# Patient Record
Sex: Male | Born: 1935 | Race: White | Hispanic: No | Marital: Married | Smoking: Current some day smoker
Health system: Southern US, Community
[De-identification: ages and names within clinical notes are randomized; demographics above are authoritative.]

## PROBLEM LIST (undated history)

## (undated) ENCOUNTER — Emergency Department (HOSPITAL_COMMUNITY): Payer: Medicare Other

## (undated) DIAGNOSIS — R739 Hyperglycemia, unspecified: Secondary | ICD-10-CM

## (undated) DIAGNOSIS — I10 Essential (primary) hypertension: Secondary | ICD-10-CM

## (undated) DIAGNOSIS — I2510 Atherosclerotic heart disease of native coronary artery without angina pectoris: Secondary | ICD-10-CM

## (undated) DIAGNOSIS — E7800 Pure hypercholesterolemia, unspecified: Secondary | ICD-10-CM

## (undated) DIAGNOSIS — N200 Calculus of kidney: Secondary | ICD-10-CM

## (undated) DIAGNOSIS — G459 Transient cerebral ischemic attack, unspecified: Secondary | ICD-10-CM

## (undated) HISTORY — DX: Hyperglycemia, unspecified: R73.9

## (undated) HISTORY — DX: Essential (primary) hypertension: I10

## (undated) HISTORY — DX: Pure hypercholesterolemia, unspecified: E78.00

## (undated) HISTORY — DX: Atherosclerotic heart disease of native coronary artery without angina pectoris: I25.10

## (undated) HISTORY — DX: Calculus of kidney: N20.0

## (undated) HISTORY — DX: Transient cerebral ischemic attack, unspecified: G45.9

---

## 2004-05-16 ENCOUNTER — Ambulatory Visit: Payer: Self-pay | Admitting: Internal Medicine

## 2004-06-03 ENCOUNTER — Ambulatory Visit: Payer: Self-pay | Admitting: Urology

## 2004-07-16 ENCOUNTER — Other Ambulatory Visit: Payer: Self-pay

## 2004-07-16 ENCOUNTER — Inpatient Hospital Stay: Payer: Self-pay | Admitting: Internal Medicine

## 2004-07-17 ENCOUNTER — Other Ambulatory Visit: Payer: Self-pay

## 2004-08-08 ENCOUNTER — Emergency Department: Payer: Self-pay | Admitting: Emergency Medicine

## 2006-08-04 ENCOUNTER — Emergency Department: Payer: Self-pay | Admitting: Emergency Medicine

## 2006-08-04 ENCOUNTER — Other Ambulatory Visit: Payer: Self-pay

## 2006-08-12 ENCOUNTER — Ambulatory Visit: Payer: Self-pay | Admitting: Neurology

## 2007-12-24 ENCOUNTER — Ambulatory Visit: Payer: Self-pay | Admitting: Urology

## 2008-09-06 ENCOUNTER — Ambulatory Visit: Payer: Self-pay | Admitting: Internal Medicine

## 2009-07-07 ENCOUNTER — Emergency Department: Payer: Self-pay | Admitting: Emergency Medicine

## 2009-08-16 ENCOUNTER — Ambulatory Visit: Payer: Self-pay | Admitting: Internal Medicine

## 2009-12-26 ENCOUNTER — Ambulatory Visit: Payer: Self-pay | Admitting: Urology

## 2010-01-24 ENCOUNTER — Ambulatory Visit: Payer: Self-pay | Admitting: Internal Medicine

## 2012-01-20 ENCOUNTER — Encounter: Payer: Self-pay | Admitting: Internal Medicine

## 2012-01-21 ENCOUNTER — Ambulatory Visit (INDEPENDENT_AMBULATORY_CARE_PROVIDER_SITE_OTHER): Payer: Medicare Other | Admitting: Internal Medicine

## 2012-01-21 ENCOUNTER — Encounter: Payer: Self-pay | Admitting: Internal Medicine

## 2012-01-21 VITALS — BP 126/68 | HR 64 | Temp 97.8°F | Ht 72.0 in | Wt 172.0 lb

## 2012-01-21 DIAGNOSIS — N200 Calculus of kidney: Secondary | ICD-10-CM

## 2012-01-21 DIAGNOSIS — Z23 Encounter for immunization: Secondary | ICD-10-CM

## 2012-01-21 DIAGNOSIS — G459 Transient cerebral ischemic attack, unspecified: Secondary | ICD-10-CM

## 2012-01-21 DIAGNOSIS — I10 Essential (primary) hypertension: Secondary | ICD-10-CM | POA: Insufficient documentation

## 2012-01-21 DIAGNOSIS — I729 Aneurysm of unspecified site: Secondary | ICD-10-CM

## 2012-01-21 DIAGNOSIS — E7800 Pure hypercholesterolemia, unspecified: Secondary | ICD-10-CM

## 2012-01-21 DIAGNOSIS — R7309 Other abnormal glucose: Secondary | ICD-10-CM

## 2012-01-21 DIAGNOSIS — R739 Hyperglycemia, unspecified: Secondary | ICD-10-CM

## 2012-01-21 DIAGNOSIS — I2510 Atherosclerotic heart disease of native coronary artery without angina pectoris: Secondary | ICD-10-CM

## 2012-01-21 NOTE — Patient Instructions (Signed)
It was nice seeing you today.  I am glad you have been doing well.  Let me know if you have any problems.  

## 2012-01-25 ENCOUNTER — Encounter: Payer: Self-pay | Admitting: Internal Medicine

## 2012-01-25 DIAGNOSIS — N200 Calculus of kidney: Secondary | ICD-10-CM | POA: Insufficient documentation

## 2012-01-25 DIAGNOSIS — G459 Transient cerebral ischemic attack, unspecified: Secondary | ICD-10-CM | POA: Insufficient documentation

## 2012-01-25 DIAGNOSIS — R739 Hyperglycemia, unspecified: Secondary | ICD-10-CM | POA: Insufficient documentation

## 2012-01-25 DIAGNOSIS — I729 Aneurysm of unspecified site: Secondary | ICD-10-CM | POA: Insufficient documentation

## 2012-01-25 NOTE — Assessment & Plan Note (Signed)
Sees cardiology.  Currently asymptomatic.  Due a follow up.  Will schedule.  Had stress echo 03/26/09 that was normal without evidence of ischemia.  EF 60-70%.  Continue aggressive risk factor modification.

## 2012-01-25 NOTE — Assessment & Plan Note (Signed)
Maintained on aspirin and Plavix.  Currently asymptomatic.  Continue risk factor modification.     

## 2012-01-25 NOTE — Assessment & Plan Note (Signed)
On crestor.  Low cholesterol diet and exercise.  Also needs to quit smoking.  Check lipid panel and liver function with next labs.

## 2012-01-25 NOTE — Assessment & Plan Note (Signed)
Sees Dr Cope.  Last evaluated in 2011.  Recommended follow up in two years.  Due.  Currently asymptomatic.     

## 2012-01-25 NOTE — Assessment & Plan Note (Signed)
Blood pressure under good control.  Follow.  Check metabolic panel.    

## 2012-01-25 NOTE — Assessment & Plan Note (Signed)
Has a history of an infrarenal abdominal aortic aneurysm and a 1.34mm right iliac aneurysm.  Was following with Dr Earnestine Leys.  Last checked 12/12.  Will need follow up with Dr Wyn Quaker 12/13.

## 2012-01-25 NOTE — Assessment & Plan Note (Signed)
Low carb diet and exercise.  Check met b and a1c.   

## 2012-01-25 NOTE — Progress Notes (Signed)
  Subjective:    Patient ID: Hunter Booth, male    DOB: [birth year], 78 y.o.   MRN: [id number]  HPI 78 year old male with past history of hypertension, hypercholesterolemia, previous TIA and known CAD with previous hospitalization for inferior MI.  He comes in today for a scheduled follow up.  States she is doing well.  Denies any chest pain or tightness with increased activity or exertion.  Eating and drinking well.  Trying to watch what he eats.  No bowel change.  Has cut down on his smoking.  Only smoking 3-4 cigarettes/day.  Discussed the need to quit.  He prefers to continue to cut down on his own.    Past Medical History  Diagnosis Date  . Hypertension   . Hypercholesterolemia   . Nephrolithiasis   . Hyperglycemia   . CAD (coronary artery disease)     status post MI  . TIA (transient ischemic attack)   . Nephrolithiasis     Review of Systems Patient denies any headache, lightheadedness or dizziness.  No sinus or allergy symptoms.  No chest pain, tightness or palpitations.  No increased shortness of breath, cough or congestion.  No nausea or vomiting.  No abdominal pain or cramping.  No bowel change, such as diarrhea, constipation, BRBPR or melana.  No urine change.  Overall he feels he is doing well.  Feels he is handling the stress of his wife's medical issues relatively well.        Objective:   Physical Exam Filed Vitals:   01/21/12 0823  BP: 126/68  Pulse: 64  Temp: 97.8 F (106.9 C)   78 year old male in no acute distress.   HEENT:  Nares - clear.  OP- without lesions or erythema.  NECK:  Supple, nontender.    HEART:  Appears to be regular. LUNGS:  Without crackles or wheezing audible.  Respirations even and unlabored.   RADIAL PULSE:  Equal bilaterally.  ABDOMEN:  Soft, nontender.  No audible abdominal bruit.   EXTREMITIES:  No increased edema to be present.                     Assessment & Plan:  PULMONARY.  Breathing stable. Continues to smoke.  Has cut down.   Follow.  See above.   HEALTH MAINTENANCE.  Physical 08/22/11.  I again talked with him regarding the need for a colonoscopy.  He declines.  Stressed to him the importance of early detection.  He declines.  PSA check 09/27/10 - .77.  Follow.

## 2012-01-28 ENCOUNTER — Other Ambulatory Visit (INDEPENDENT_AMBULATORY_CARE_PROVIDER_SITE_OTHER): Payer: Medicare Other

## 2012-01-28 DIAGNOSIS — R739 Hyperglycemia, unspecified: Secondary | ICD-10-CM

## 2012-01-28 DIAGNOSIS — I10 Essential (primary) hypertension: Secondary | ICD-10-CM

## 2012-01-28 DIAGNOSIS — R7309 Other abnormal glucose: Secondary | ICD-10-CM

## 2012-01-28 DIAGNOSIS — E7800 Pure hypercholesterolemia, unspecified: Secondary | ICD-10-CM

## 2012-01-28 LAB — HEPATIC FUNCTION PANEL
ALT: 10 U/L (ref 0–53)
Albumin: 3.8 g/dL (ref 3.5–5.2)
Alkaline Phosphatase: 58 U/L (ref 39–117)
Total Protein: 6.6 g/dL (ref 6.0–8.3)

## 2012-01-28 LAB — LIPID PANEL
Cholesterol: 369 mg/dL — ABNORMAL HIGH (ref 0–200)
HDL: 27.5 mg/dL — ABNORMAL LOW (ref >39.00)
VLDL: 42.6 mg/dL — ABNORMAL HIGH (ref 0.0–40.0)

## 2012-01-28 LAB — BASIC METABOLIC PANEL
GFR: 64.01 mL/min (ref >60.00)
Potassium: 4.1 mEq/L (ref 3.5–5.1)
Sodium: 139 mEq/L (ref 135–145)

## 2012-01-28 LAB — LDL CHOLESTEROL, DIRECT: Direct LDL: 272.5 mg/dL

## 2012-01-31 ENCOUNTER — Telehealth: Payer: Self-pay | Admitting: Internal Medicine

## 2012-01-31 DIAGNOSIS — Z125 Encounter for screening for malignant neoplasm of prostate: Secondary | ICD-10-CM

## 2012-01-31 DIAGNOSIS — I10 Essential (primary) hypertension: Secondary | ICD-10-CM

## 2012-01-31 DIAGNOSIS — R739 Hyperglycemia, unspecified: Secondary | ICD-10-CM

## 2012-01-31 DIAGNOSIS — E7800 Pure hypercholesterolemia, unspecified: Secondary | ICD-10-CM

## 2012-01-31 NOTE — Telephone Encounter (Signed)
Pt notified of lab results.  Wants to come in on 05/18/12 for follow up labs - at 9:30 .  I have ordered the labs, please put on lab schedule.  Thanks.

## 2012-02-02 NOTE — Telephone Encounter (Signed)
Pt  Aware of appointment

## 2012-02-24 ENCOUNTER — Other Ambulatory Visit: Payer: Self-pay | Admitting: Internal Medicine

## 2012-02-26 ENCOUNTER — Other Ambulatory Visit: Payer: Self-pay | Admitting: Internal Medicine

## 2012-02-27 NOTE — Telephone Encounter (Signed)
rx sent in to [facility] (#90 with 3 refills)

## 2012-03-01 ENCOUNTER — Encounter: Payer: Self-pay | Admitting: Internal Medicine

## 2012-03-04 ENCOUNTER — Encounter: Payer: Self-pay | Admitting: Internal Medicine

## 2012-05-18 ENCOUNTER — Other Ambulatory Visit (INDEPENDENT_AMBULATORY_CARE_PROVIDER_SITE_OTHER): Payer: Medicare Other

## 2012-05-18 DIAGNOSIS — I10 Essential (primary) hypertension: Secondary | ICD-10-CM

## 2012-05-18 DIAGNOSIS — R739 Hyperglycemia, unspecified: Secondary | ICD-10-CM

## 2012-05-18 DIAGNOSIS — Z125 Encounter for screening for malignant neoplasm of prostate: Secondary | ICD-10-CM

## 2012-05-18 DIAGNOSIS — R7309 Other abnormal glucose: Secondary | ICD-10-CM

## 2012-05-18 DIAGNOSIS — E7800 Pure hypercholesterolemia, unspecified: Secondary | ICD-10-CM

## 2012-05-18 LAB — BASIC METABOLIC PANEL
BUN: 18 mg/dL (ref 6–23)
GFR: 65.24 mL/min (ref >60.00)
Glucose, Bld: 149 mg/dL — ABNORMAL HIGH (ref 70–99)
Potassium: 3.8 mEq/L (ref 3.5–5.1)

## 2012-05-18 LAB — LIPID PANEL
HDL: 27.7 mg/dL — ABNORMAL LOW (ref >39.00)
LDL Cholesterol: 128 mg/dL — ABNORMAL HIGH (ref 0–99)
Total CHOL/HDL Ratio: 6

## 2012-05-18 LAB — HEPATIC FUNCTION PANEL
AST: 18 U/L (ref 0–37)
Total Bilirubin: 0.9 mg/dL (ref 0.3–1.2)

## 2012-05-20 ENCOUNTER — Ambulatory Visit (INDEPENDENT_AMBULATORY_CARE_PROVIDER_SITE_OTHER): Payer: Medicare Other | Admitting: Internal Medicine

## 2012-05-20 ENCOUNTER — Encounter: Payer: Self-pay | Admitting: Internal Medicine

## 2012-05-20 VITALS — BP 110/70 | HR 65 | Temp 97.4°F | Ht 72.0 in | Wt 173.2 lb

## 2012-05-20 DIAGNOSIS — N200 Calculus of kidney: Secondary | ICD-10-CM

## 2012-05-20 DIAGNOSIS — I729 Aneurysm of unspecified site: Secondary | ICD-10-CM

## 2012-05-20 DIAGNOSIS — G459 Transient cerebral ischemic attack, unspecified: Secondary | ICD-10-CM

## 2012-05-20 DIAGNOSIS — R739 Hyperglycemia, unspecified: Secondary | ICD-10-CM

## 2012-05-20 DIAGNOSIS — R7309 Other abnormal glucose: Secondary | ICD-10-CM

## 2012-05-20 DIAGNOSIS — E7800 Pure hypercholesterolemia, unspecified: Secondary | ICD-10-CM

## 2012-05-20 DIAGNOSIS — I10 Essential (primary) hypertension: Secondary | ICD-10-CM

## 2012-05-20 DIAGNOSIS — I2510 Atherosclerotic heart disease of native coronary artery without angina pectoris: Secondary | ICD-10-CM

## 2012-05-26 ENCOUNTER — Other Ambulatory Visit: Payer: Self-pay | Admitting: Internal Medicine

## 2012-05-27 ENCOUNTER — Other Ambulatory Visit: Payer: Self-pay | Admitting: *Deleted

## 2012-05-27 MED ORDER — CLOPIDOGREL BISULFATE 75 MG PO TABS: 75.0000 mg | ORAL_TABLET | Freq: Every day | ORAL | Status: DC

## 2012-05-27 NOTE — Telephone Encounter (Signed)
Script for plavix filled

## 2012-05-31 ENCOUNTER — Encounter: Payer: Self-pay | Admitting: Internal Medicine

## 2012-05-31 NOTE — Assessment & Plan Note (Signed)
On crestor.  Low cholesterol diet and exercise.  Also needs to quit smoking.  Lipid panel improved.  05/18/12 lab revealed total cholesterol 179, triglycerides 119, HDL 28 and LDL 128.  Much improved.  Follow.

## 2012-05-31 NOTE — Assessment & Plan Note (Signed)
Has a history of an infrarenal abdominal aortic aneurysm and a 1.6mm right iliac aneurysm.  Was following with Dr Hearn.  Last checked 12/12.  Will need follow up with Dr Dew.      

## 2012-05-31 NOTE — Progress Notes (Signed)
Subjective:    Patient ID: Hunter Booth, male    DOB: Dec 20, 1931, 79 y.o.   MRN: [id number]  HPI 79 year old male with past history of hypertension, hypercholesterolemia, previous TIA and known CAD with previous hospitalization for inferior MI.  He comes in today for a scheduled follow up.  States he is doing well.  Denies any chest pain or tightness with increased activity or exertion.  Eating and drinking well.  Trying to watch what he eats.  No bowel change.  Has cut down on his smoking.  Only smoking 3-4 cigarettes/day.  Discussed the need to quit.  He prefers to continue to cut down on his own.   Has adjusted his diet.  Cholesterol improved.  Handling stress well.  Still dealing with his wife medical issues.     Past Medical History  Diagnosis Date  . Hypertension   . Hypercholesterolemia   . Nephrolithiasis   . Hyperglycemia   . CAD (coronary artery disease)     status post MI  . TIA (transient ischemic attack)   . Nephrolithiasis      Current Outpatient Prescriptions on File Prior to Visit  Medication Sig Dispense Refill  . aspirin 325 MG EC tablet Take 325 mg by mouth daily.      . felodipine (PLENDIL) 5 MG 24 hr tablet Take 5 mg by mouth 2 (two) times daily.      . hydrochlorothiazide (HYDRODIURIL) 25 MG tablet Take 25 mg by mouth daily.      Marland Kitchen lisinopril (PRINIVIL,ZESTRIL) 40 MG tablet Take 40 mg by mouth daily.      . metoprolol succinate (TOPROL-XL) 50 MG 24 hr tablet TAKE ONE TABLET TWICE DAILY  180 tablet  3  . potassium chloride (K-DUR) 10 MEQ tablet Take 10 mEq by mouth daily.      . rosuvastatin (CRESTOR) 20 MG tablet Take 20 mg by mouth daily.      Marland Kitchen albuterol-ipratropium (COMBIVENT) 18-103 MCG/ACT inhaler Inhale 2 puffs into the lungs 2 (two) times daily as needed.       No current facility-administered medications on file prior to visit.    Review of Systems Patient denies any headache, lightheadedness or dizziness.  No sinus or allergy symptoms.  No chest  pain, tightness or palpitations.  No increased shortness of breath, cough or congestion.  No nausea or vomiting.  No acid reflux.  No abdominal pain or cramping.  No bowel change, such as diarrhea, constipation, BRBPR or melana.  No urine change.  Overall he feels he is doing well.  Feels he is handling the stress of his wife's medical issues relatively well.        Objective:   Physical Exam  Filed Vitals:   05/20/12 0937  BP: 110/70  Pulse: 65  Temp: 97.4 F (36.3 C)   Blood pressure recheck:  122/64, pulse 63  79 year old male in no acute distress.   HEENT:  Nares - clear.  OP- without lesions or erythema.  NECK:  Supple, nontender.    HEART:  Appears to be regular. LUNGS:  Without crackles or wheezing audible.  Respirations even and unlabored.   RADIAL PULSE:  Equal bilaterally.  ABDOMEN:  Soft, nontender.  No audible abdominal bruit.   EXTREMITIES:  No increased edema to be present.                     Assessment & Plan:  PULMONARY.  Breathing stable. Continues to smoke.  Has cut down.  Follow.  See above.   HEALTH MAINTENANCE.  Physical 08/22/11.  I again talked with him regarding the need for a colonoscopy.  He declines.  Stressed to him the importance of early detection.  He declines.  PSA check 05/18/12 - .74.  Follow.

## 2012-05-31 NOTE — Assessment & Plan Note (Signed)
Low carb diet and exercise.  Follow met b and a1c. A1c just checked 05/18/12 - 6.3.

## 2012-05-31 NOTE — Assessment & Plan Note (Signed)
Sees Dr Achilles Dunk.  Last evaluated in 2011.  Recommended follow up in two years.  Due.  Currently asymptomatic.

## 2012-05-31 NOTE — Assessment & Plan Note (Signed)
Maintained on aspirin and Plavix.  Currently asymptomatic.  Continue risk factor modification.

## 2012-05-31 NOTE — Assessment & Plan Note (Signed)
Blood pressure under good control.  Follow.  Follow metabolic panel.  Cr just checked 1.2 (05/17/12).

## 2012-05-31 NOTE — Assessment & Plan Note (Signed)
Sees cardiology.  Currently asymptomatic.  Had stress echo 03/26/09 that was normal without evidence of ischemia.  EF 60-70%.  Continue aggressive risk factor modification.    

## 2012-09-06 ENCOUNTER — Other Ambulatory Visit: Payer: Self-pay | Admitting: Internal Medicine

## 2012-09-20 ENCOUNTER — Encounter: Payer: Medicare Other | Admitting: Internal Medicine

## 2012-10-08 ENCOUNTER — Other Ambulatory Visit: Payer: Self-pay | Admitting: Internal Medicine

## 2012-11-01 ENCOUNTER — Encounter: Payer: Self-pay | Admitting: Internal Medicine

## 2012-11-01 ENCOUNTER — Ambulatory Visit (INDEPENDENT_AMBULATORY_CARE_PROVIDER_SITE_OTHER): Payer: Medicare Other | Admitting: Internal Medicine

## 2012-11-01 VITALS — BP 118/80 | HR 61 | Temp 97.8°F | Ht 69.0 in | Wt 168.2 lb

## 2012-11-01 DIAGNOSIS — G459 Transient cerebral ischemic attack, unspecified: Secondary | ICD-10-CM

## 2012-11-01 DIAGNOSIS — I729 Aneurysm of unspecified site: Secondary | ICD-10-CM

## 2012-11-01 DIAGNOSIS — R739 Hyperglycemia, unspecified: Secondary | ICD-10-CM

## 2012-11-01 DIAGNOSIS — E7800 Pure hypercholesterolemia, unspecified: Secondary | ICD-10-CM

## 2012-11-01 DIAGNOSIS — I10 Essential (primary) hypertension: Secondary | ICD-10-CM

## 2012-11-01 DIAGNOSIS — R7309 Other abnormal glucose: Secondary | ICD-10-CM

## 2012-11-01 DIAGNOSIS — I2510 Atherosclerotic heart disease of native coronary artery without angina pectoris: Secondary | ICD-10-CM

## 2012-11-01 DIAGNOSIS — N200 Calculus of kidney: Secondary | ICD-10-CM

## 2012-11-01 NOTE — Assessment & Plan Note (Signed)
Low carb diet and exercise.  Follow met b and a1c. A1c just checked 05/18/12 - 6.3.

## 2012-11-01 NOTE — Assessment & Plan Note (Signed)
On crestor.  Low cholesterol diet and exercise.  Also needs to quit smoking.  Lipid panel improved.  05/18/12 lab revealed total cholesterol 179, triglycerides 119, HDL 28 and LDL 128.  Much improved.  Follow.   Due for a check.

## 2012-11-01 NOTE — Assessment & Plan Note (Signed)
Has a history of an infrarenal abdominal aortic aneurysm and a 1.80mm right iliac aneurysm.  Was following with Dr Earnestine Leys.  Last checked 12/12.  Will need follow up with Dr Wyn Quaker.  Obtain records.

## 2012-11-01 NOTE — Assessment & Plan Note (Addendum)
Maintained on aspirin and Plavix.  Currently asymptomatic.  Continue risk factor modification.  Has noticed the vision change.  Has been present for months.  Unclear when occurred.  Wanted to schedule an immediate appt with [facility].  He states he will schedule his own appt asap.  States cardiology has recently checked his carotids.  Obtain results.

## 2012-11-01 NOTE — Assessment & Plan Note (Signed)
Sees Dr Achilles Dunk.  Last evaluated in 2011.  Recommended follow up in two years.  Due.  Currently asymptomatic.

## 2012-11-01 NOTE — Assessment & Plan Note (Signed)
Sees cardiology.  Currently asymptomatic.  Had stress echo 03/26/09 that was normal without evidence of ischemia.  EF 60-70%.  Continue aggressive risk factor modification.    

## 2012-11-01 NOTE — Progress Notes (Signed)
Subjective:    Patient ID: Hunter Booth, male    DOB: 1931/06/21, 79 y.o.   MRN: [id number]  HPI 79 year old male with past history of hypertension, hypercholesterolemia, previous TIA and known CAD with previous hospitalization for inferior MI.  He comes in today to follow up on these issues as well as for a complete physical exam.  States he is doing well.  Denies any chest pain or tightness with increased activity or exertion.  Stays busy and active. Eating and drinking well.  Trying to watch what he eats.  No bowel change.  Has cut down on his smoking.  Only smoking 2 cigarettes/day.  Discussed the need to quit.  He prefers to continue to cut down on his own.   Has adjusted his diet.  Cholesterol last checked improved.  Handling stress well.  Still dealing with his wife medical issues.  He does report that he has noticed some visual change - with part of his visual field.  Unable to see top of visual field left eye.  Noticed months ago.  States could have been there longer.  Only notices when he covers his right eye.       Past Medical History  Diagnosis Date  . Hypertension   . Hypercholesterolemia   . Nephrolithiasis   . Hyperglycemia   . CAD (coronary artery disease)     status post MI  . TIA (transient ischemic attack)   . Nephrolithiasis     Current Outpatient Prescriptions on File Prior to Visit  Medication Sig Dispense Refill  . aspirin 325 MG EC tablet Take 325 mg by mouth daily.      . clopidogrel (PLAVIX) 75 MG tablet TAKE ONE (1) TABLET EACH DAY  30 tablet  5  . felodipine (PLENDIL) 5 MG 24 hr tablet TAKE ONE TABLET TWICE DAILY  60 tablet  5  . hydrochlorothiazide (HYDRODIURIL) 25 MG tablet TAKE ONE (1) TABLET EACH DAY  90 tablet  1  . lisinopril (PRINIVIL,ZESTRIL) 40 MG tablet TAKE ONE (1) TABLET EACH DAY  90 tablet  1  . metoprolol succinate (TOPROL-XL) 50 MG 24 hr tablet TAKE ONE TABLET TWICE DAILY  180 tablet  3  . potassium chloride (K-DUR) 10 MEQ tablet Take 10 mEq by  mouth daily.      . rosuvastatin (CRESTOR) 20 MG tablet Take 20 mg by mouth daily.       No current facility-administered medications on file prior to visit.    Review of Systems Patient denies any headache, lightheadedness or dizziness.  Vision change as outlined.  No sinus or allergy symptoms.  No chest pain, tightness or palpitations.  No increased shortness of breath, cough or congestion.  No nausea or vomiting.  No acid reflux.  No abdominal pain or cramping.  No bowel change, such as diarrhea, constipation, BRBPR or melana.  No urine change.  Overall he feels he is doing well.  Feels he is handling the stress of his wife's medical issues relatively well.        Objective:   Physical Exam  Filed Vitals:   11/01/12 0834  BP: 118/80  Pulse: 61  Temp: 97.8 F (36.6 C)   Blood pressure recheck:  120/68, pulse 19  79 year old male in no acute distress.  HEENT:  Nares - clear.  Oropharynx - without lesions. NECK:  Supple.  Nontender.  No audible carotid bruit.  HEART:  Appears to be regular.   LUNGS:  No crackles  or wheezing audible.  Respirations even and unlabored.   RADIAL PULSE:  Equal bilaterally.  ABDOMEN:  Soft.  Nontender.  Bowel sounds present and normal.  No audible abdominal bruit.  GU:  Normal descended testicles.  No palpable testicular nodules.   RECTAL:  Could not appreciate any palpable prostate nodules.  Heme negative.   EXTREMITIES:  No increased edema present.  PT pulse palpable right foot.  Slightly diminished on the left.   FEET:  No lesions.          Assessment & Plan:  PULMONARY.  Breathing stable. Continues to smoke.  Has cut down.  Follow.  See above.  Wants to continue to quit on his own.    HEALTH MAINTENANCE.  Physical today.  I again talked with him regarding the need for a colonoscopy.  He declines.  Stressed to him the importance of early detection.  He declines.  PSA check 05/18/12 - .74.  Follow.

## 2012-11-01 NOTE — Assessment & Plan Note (Signed)
Blood pressure under good control.  Follow.  Follow metabolic panel.  Cr last checked 1.2 (05/17/12).

## 2012-11-09 ENCOUNTER — Other Ambulatory Visit: Payer: Self-pay | Admitting: Internal Medicine

## 2012-12-30 ENCOUNTER — Ambulatory Visit: Payer: Self-pay | Admitting: Ophthalmology

## 2012-12-30 DIAGNOSIS — I10 Essential (primary) hypertension: Secondary | ICD-10-CM

## 2013-01-05 ENCOUNTER — Ambulatory Visit: Payer: Self-pay | Admitting: Ophthalmology

## 2013-01-25 ENCOUNTER — Other Ambulatory Visit (INDEPENDENT_AMBULATORY_CARE_PROVIDER_SITE_OTHER): Payer: Medicare Other

## 2013-01-25 DIAGNOSIS — R7309 Other abnormal glucose: Secondary | ICD-10-CM

## 2013-01-25 DIAGNOSIS — E7800 Pure hypercholesterolemia, unspecified: Secondary | ICD-10-CM

## 2013-01-25 DIAGNOSIS — I2510 Atherosclerotic heart disease of native coronary artery without angina pectoris: Secondary | ICD-10-CM

## 2013-01-25 DIAGNOSIS — I10 Essential (primary) hypertension: Secondary | ICD-10-CM

## 2013-01-25 DIAGNOSIS — R739 Hyperglycemia, unspecified: Secondary | ICD-10-CM

## 2013-01-25 LAB — CBC WITH DIFFERENTIAL/PLATELET
Eosinophils Relative: 1.2 % (ref 0.0–5.0)
HCT: 42.1 % (ref 39.0–52.0)
Lymphs Abs: 2.3 10*3/uL (ref 0.7–4.0)
MCV: 92.9 fl (ref 78.0–100.0)
Monocytes Absolute: 0.9 10*3/uL (ref 0.1–1.0)
Platelets: 189 10*3/uL (ref 150.0–400.0)
RDW: 14.2 % (ref 11.5–14.6)
WBC: 8.7 10*3/uL (ref 4.5–10.5)

## 2013-01-25 LAB — HEPATIC FUNCTION PANEL
AST: 13 U/L (ref 0–37)
Alkaline Phosphatase: 66 U/L (ref 39–117)
Bilirubin, Direct: 0.2 mg/dL (ref 0.0–0.3)
Total Bilirubin: 1 mg/dL (ref 0.3–1.2)

## 2013-01-25 LAB — BASIC METABOLIC PANEL
CO2: 32 mEq/L (ref 19–32)
Calcium: 9.1 mg/dL (ref 8.4–10.5)
Creatinine, Ser: 1.1 mg/dL (ref 0.4–1.5)
Glucose, Bld: 134 mg/dL — ABNORMAL HIGH (ref 70–99)

## 2013-01-25 LAB — MICROALBUMIN / CREATININE URINE RATIO
Creatinine,U: 271.1 mg/dL
Microalb Creat Ratio: 0.6 mg/g (ref 0.0–30.0)

## 2013-01-25 LAB — LIPID PANEL: Total CHOL/HDL Ratio: 6

## 2013-01-25 LAB — LDL CHOLESTEROL, DIRECT: Direct LDL: 157.2 mg/dL

## 2013-01-27 ENCOUNTER — Encounter: Payer: Self-pay | Admitting: *Deleted

## 2013-02-14 ENCOUNTER — Encounter: Payer: Self-pay | Admitting: Internal Medicine

## 2013-03-07 ENCOUNTER — Ambulatory Visit (INDEPENDENT_AMBULATORY_CARE_PROVIDER_SITE_OTHER): Payer: Medicare Other | Admitting: Internal Medicine

## 2013-03-07 ENCOUNTER — Encounter: Payer: Self-pay | Admitting: Internal Medicine

## 2013-03-07 ENCOUNTER — Encounter (INDEPENDENT_AMBULATORY_CARE_PROVIDER_SITE_OTHER): Payer: Self-pay

## 2013-03-07 VITALS — BP 130/70 | HR 63 | Temp 98.0°F | Ht 69.0 in | Wt 172.0 lb

## 2013-03-07 DIAGNOSIS — R5383 Other fatigue: Secondary | ICD-10-CM

## 2013-03-07 DIAGNOSIS — E7800 Pure hypercholesterolemia, unspecified: Secondary | ICD-10-CM

## 2013-03-07 DIAGNOSIS — I729 Aneurysm of unspecified site: Secondary | ICD-10-CM

## 2013-03-07 DIAGNOSIS — R739 Hyperglycemia, unspecified: Secondary | ICD-10-CM

## 2013-03-07 DIAGNOSIS — R5381 Other malaise: Secondary | ICD-10-CM

## 2013-03-07 DIAGNOSIS — N200 Calculus of kidney: Secondary | ICD-10-CM

## 2013-03-07 DIAGNOSIS — R7309 Other abnormal glucose: Secondary | ICD-10-CM

## 2013-03-07 DIAGNOSIS — G459 Transient cerebral ischemic attack, unspecified: Secondary | ICD-10-CM

## 2013-03-07 DIAGNOSIS — I2510 Atherosclerotic heart disease of native coronary artery without angina pectoris: Secondary | ICD-10-CM

## 2013-03-07 DIAGNOSIS — I10 Essential (primary) hypertension: Secondary | ICD-10-CM

## 2013-03-07 NOTE — Progress Notes (Signed)
Pre-visit discussion using our clinic review tool. No additional management support is needed unless otherwise documented below in the visit note.  

## 2013-03-07 NOTE — Progress Notes (Signed)
Subjective:    Patient ID: Hunter Booth, male    DOB: 1931-08-28, 79 y.o.   MRN: [id number]  HPI 79 year old male with past history of hypertension, hypercholesterolemia, previous TIA and known CAD with previous hospitalization for inferior MI.  He comes in today for a scheduled follow up.   States he is doing well.  Denies any chest pain or tightness with increased activity or exertion.  Stays busy and active. Eating and drinking well.  Trying to watch what he eats.  No bowel change.  Has cut down on his smoking.  Only smoking 3 cigarettes/day.  Discussed the need to quit.  He prefers to continue to cut down on his own.   Has adjusted his diet.  Handling stress well.  Still dealing with his wife medical issues.  Is s/p recent eye surgery.  Did well with the surgery.       Past Medical History  Diagnosis Date  . Hypertension   . Hypercholesterolemia   . Nephrolithiasis   . Hyperglycemia   . CAD (coronary artery disease)     status post MI  . TIA (transient ischemic attack)   . Nephrolithiasis     Current Outpatient Prescriptions on File Prior to Visit  Medication Sig Dispense Refill  . aspirin 325 MG EC tablet Take 325 mg by mouth daily.      . clopidogrel (PLAVIX) 75 MG tablet TAKE ONE (1) TABLET EACH DAY  30 tablet  5  . CRESTOR 20 MG tablet TAKE ONE (1) TABLET EACH DAY  90 tablet  1  . felodipine (PLENDIL) 5 MG 24 hr tablet TAKE ONE TABLET TWICE DAILY  60 tablet  5  . hydrochlorothiazide (HYDRODIURIL) 25 MG tablet TAKE ONE (1) TABLET EACH DAY  90 tablet  1  . KLOR-CON 10 10 MEQ tablet TAKE ONE (1) TABLET EACH DAY  90 tablet  1  . lisinopril (PRINIVIL,ZESTRIL) 40 MG tablet TAKE ONE (1) TABLET EACH DAY  90 tablet  1  . metoprolol succinate (TOPROL-XL) 50 MG 24 hr tablet TAKE ONE TABLET TWICE DAILY  180 tablet  3   No current facility-administered medications on file prior to visit.    Review of Systems Patient denies any headache, lightheadedness or dizziness.  No sinus or  allergy symptoms.  No chest pain, tightness or palpitations.  No increased shortness of breath, cough or congestion.  No nausea or vomiting.  No acid reflux.  No abdominal pain or cramping.  No bowel change, such as diarrhea, constipation, BRBPR or melana.  No urine change.  Overall he feels he is doing well.  Feels he is handling the stress of his wife's medical issues relatively well.  Eye is doing well.  S/p surgery.       Objective:   Physical Exam  Filed Vitals:   03/07/13 0936  BP: 130/70  Pulse: 63  Temp: 98 F (72.36 C)   79 year old male in no acute distress.  HEENT:  Nares - clear.  Oropharynx - without lesions. NECK:  Supple.  Nontender.   HEART:  Appears to be regular.   LUNGS:  No crackles or wheezing audible.  Respirations even and unlabored.   RADIAL PULSE:  Equal bilaterally.  ABDOMEN:  Soft.  Nontender.  Bowel sounds present and normal.  No audible abdominal bruit.    EXTREMITIES:  No increased edema present.  PT pulse palpable right foot.  Slightly diminished on the left.   FEET:  No lesions.  Assessment & Plan:  PULMONARY.  Breathing stable. Continues to smoke.  Has cut down.  Follow.  See above.  Wants to continue to quit on his own.    HEALTH MAINTENANCE.  Physical 09/02/12.  I again talked with him regarding the need for a colonoscopy.  He declines.  Stressed to him the importance of early detection.  He declines.  PSA check 05/18/12 - .74.  Follow.

## 2013-03-08 ENCOUNTER — Encounter: Payer: Self-pay | Admitting: Internal Medicine

## 2013-03-10 ENCOUNTER — Encounter: Payer: Self-pay | Admitting: Internal Medicine

## 2013-03-10 NOTE — Assessment & Plan Note (Signed)
Blood pressure under good control.  Follow.  Follow metabolic panel.    

## 2013-03-10 NOTE — Assessment & Plan Note (Signed)
Maintained on aspirin and Plavix.  Currently asymptomatic.  Continue risk factor modification.   States cardiology has recently checked his carotids.  Follow.

## 2013-03-10 NOTE — Assessment & Plan Note (Signed)
Sees Dr Cope.  Currently asymptomatic.    

## 2013-03-10 NOTE — Assessment & Plan Note (Signed)
On crestor.  Low cholesterol diet and exercise.  Also needs to quit smoking.  Working on diet and exercise.  Follow.

## 2013-03-10 NOTE — Assessment & Plan Note (Signed)
Has a history of an infrarenal abdominal aortic aneurysm and a 1.31mm right iliac aneurysm.  Was following with Dr Hulda Humphrey.  Last checked 12/12.  Will need follow up with Dr Lucky Cowboy.

## 2013-03-10 NOTE — Assessment & Plan Note (Signed)
Sees cardiology.  Currently asymptomatic.  Had stress echo 03/26/09 that was normal without evidence of ischemia.  EF 60-70%.  Continue aggressive risk factor modification.    

## 2013-03-10 NOTE — Assessment & Plan Note (Signed)
Low carb diet and exercise.  Follow met b and a1c.   

## 2013-03-12 ENCOUNTER — Other Ambulatory Visit: Payer: Self-pay | Admitting: Internal Medicine

## 2013-03-22 ENCOUNTER — Other Ambulatory Visit: Payer: Self-pay | Admitting: Internal Medicine

## 2013-04-18 ENCOUNTER — Other Ambulatory Visit: Payer: Self-pay | Admitting: Internal Medicine

## 2013-05-13 ENCOUNTER — Ambulatory Visit: Payer: Self-pay | Admitting: Ophthalmology

## 2013-05-13 LAB — POTASSIUM: Potassium: 5.2 mmol/L — ABNORMAL HIGH (ref 3.5–5.1)

## 2013-05-24 ENCOUNTER — Ambulatory Visit: Payer: Self-pay | Admitting: Ophthalmology

## 2013-05-25 ENCOUNTER — Other Ambulatory Visit: Payer: Self-pay | Admitting: Internal Medicine

## 2013-06-06 ENCOUNTER — Other Ambulatory Visit: Payer: Medicare Other

## 2013-06-08 ENCOUNTER — Ambulatory Visit: Payer: Medicare Other | Admitting: Internal Medicine

## 2013-06-17 ENCOUNTER — Ambulatory Visit (INDEPENDENT_AMBULATORY_CARE_PROVIDER_SITE_OTHER): Payer: Medicare Other | Admitting: Internal Medicine

## 2013-06-17 ENCOUNTER — Encounter: Payer: Self-pay | Admitting: Internal Medicine

## 2013-06-17 ENCOUNTER — Encounter (INDEPENDENT_AMBULATORY_CARE_PROVIDER_SITE_OTHER): Payer: Self-pay

## 2013-06-17 VITALS — BP 120/76 | HR 60 | Temp 97.8°F | Wt 168.0 lb

## 2013-06-17 DIAGNOSIS — H6190 Disorder of external ear, unspecified, unspecified ear: Secondary | ICD-10-CM

## 2013-06-17 DIAGNOSIS — E7800 Pure hypercholesterolemia, unspecified: Secondary | ICD-10-CM

## 2013-06-17 DIAGNOSIS — N200 Calculus of kidney: Secondary | ICD-10-CM

## 2013-06-17 DIAGNOSIS — R7309 Other abnormal glucose: Secondary | ICD-10-CM

## 2013-06-17 DIAGNOSIS — I10 Essential (primary) hypertension: Secondary | ICD-10-CM

## 2013-06-17 DIAGNOSIS — I729 Aneurysm of unspecified site: Secondary | ICD-10-CM

## 2013-06-17 DIAGNOSIS — I2510 Atherosclerotic heart disease of native coronary artery without angina pectoris: Secondary | ICD-10-CM

## 2013-06-17 DIAGNOSIS — H6192 Disorder of left external ear, unspecified: Secondary | ICD-10-CM

## 2013-06-17 DIAGNOSIS — Z125 Encounter for screening for malignant neoplasm of prostate: Secondary | ICD-10-CM

## 2013-06-17 DIAGNOSIS — R739 Hyperglycemia, unspecified: Secondary | ICD-10-CM

## 2013-06-17 DIAGNOSIS — G459 Transient cerebral ischemic attack, unspecified: Secondary | ICD-10-CM

## 2013-06-17 NOTE — Progress Notes (Signed)
Pre visit review using our clinic review tool, if applicable. No additional management support is needed unless otherwise documented below in the visit note. 

## 2013-06-20 ENCOUNTER — Encounter: Payer: Self-pay | Admitting: Internal Medicine

## 2013-06-20 DIAGNOSIS — H6192 Disorder of left external ear, unspecified: Secondary | ICD-10-CM | POA: Insufficient documentation

## 2013-06-20 NOTE — Assessment & Plan Note (Signed)
Sees Dr Cope.  Currently asymptomatic.    

## 2013-06-20 NOTE — Assessment & Plan Note (Signed)
Has a history of an infrarenal abdominal aortic aneurysm and a 1.4mm right iliac aneurysm.  Was following with Dr Hulda Humphrey.  Last checked 12/12.  Will need follow up [facility].  Schedule appt.

## 2013-06-20 NOTE — Assessment & Plan Note (Addendum)
Maintained on aspirin and Plavix.  Currently asymptomatic.  Continue risk factor modification.   States cardiology following his carotids.  Follow.   

## 2013-06-20 NOTE — Assessment & Plan Note (Signed)
On crestor.  Low cholesterol diet and exercise.  Also needs to quit smoking.  Working on diet and exercise.  Follow.

## 2013-06-20 NOTE — Assessment & Plan Note (Signed)
Low carb diet and exercise.  Follow met b and a1c.   

## 2013-06-20 NOTE — Progress Notes (Signed)
Subjective:    Patient ID: Hunter Booth, male    DOB: 1932/02/28, 80 y.o.   MRN: [id number]  HPI 80 year old male with past history of hypertension, hypercholesterolemia, previous TIA and known CAD with previous hospitalization for inferior MI.  He comes in today for a scheduled follow up.   States he is doing well.  Denies any chest pain or tightness with increased activity or exertion.  Stays busy and active. Eating and drinking well.  Trying to watch what he eats.  No bowel change.  Has cut down on his smoking.  Only smoking 3 cigarettes/day.  Discussed the need to quit.  He prefers to continue to cut down on his own.   Has adjusted his diet.  Handling stress well.  Still dealing with his wife medical issues.  Is s/p recent eye surgery.  Did well with the surgery.  Overall he feels good.  Has had a persistent ear lesion.  Needs evaluation.  No pain.  Worsened over the last month.       Past Medical History  Diagnosis Date  . Hypertension   . Hypercholesterolemia   . Nephrolithiasis   . Hyperglycemia   . CAD (coronary artery disease)     status post MI  . TIA (transient ischemic attack)   . Nephrolithiasis     Current Outpatient Prescriptions on File Prior to Visit  Medication Sig Dispense Refill  . aspirin 325 MG EC tablet Take 325 mg by mouth daily.      . clopidogrel (PLAVIX) 75 MG tablet TAKE ONE (1) TABLET EACH DAY  30 tablet  5  . CRESTOR 20 MG tablet TAKE ONE (1) TABLET EACH DAY  90 tablet  1  . felodipine (PLENDIL) 5 MG 24 hr tablet TAKE ONE TABLET TWICE DAILY  60 tablet  5  . hydrochlorothiazide (HYDRODIURIL) 25 MG tablet TAKE ONE (1) TABLET EACH DAY  90 tablet  1  . lisinopril (PRINIVIL,ZESTRIL) 40 MG tablet TAKE ONE (1) TABLET EACH DAY  90 tablet  1  . metoprolol succinate (TOPROL-XL) 50 MG 24 hr tablet TAKE ONE TABLET TWICE DAILY  180 tablet  1  . potassium chloride (K-DUR) 10 MEQ tablet TAKE ONE (1) TABLET EACH DAY  90 tablet  1   No current facility-administered  medications on file prior to visit.    Review of Systems Patient denies any headache, lightheadedness or dizziness.  No sinus or allergy symptoms.  No chest pain, tightness or palpitations.  No increased shortness of breath, cough or congestion.  No nausea or vomiting.  No acid reflux.  No abdominal pain or cramping.  No bowel change, such as diarrhea, constipation, BRBPR or melana.  No urine change.  Overall he feels he is doing well.  Feels he is handling the stress of his wife's medical issues relatively well.  Eye is doing well.  S/p surgery.       Objective:   Physical Exam  Filed Vitals:   06/17/13 1007  BP: 120/76  Pulse: 60  Temp: 97.8 F (23.54 C)   80 year old male in no acute distress.  HEENT:  Nares - clear.  Oropharynx - without lesions.  External Ear - thickened lesion.  NECK:  Supple.  Nontender.  Audible bruit.  HEART:  Appears to be regular.   LUNGS:  No crackles or wheezing audible.  Respirations even and unlabored.   RADIAL PULSE:  Equal bilaterally.  ABDOMEN:  Soft.  Nontender.  Bowel sounds present  and normal.  No audible abdominal bruit.    EXTREMITIES:  No increased edema present.  PT pulse palpable right foot.  Slightly diminished on the left.   FEET:  No lesions.          Assessment & Plan:  PULMONARY.  Breathing stable. Continues to smoke.  Has cut down.  Follow.  See above.  Wants to continue to quit on his own.    HEALTH MAINTENANCE.  Physical 09/02/12.  I again talked with him regarding the need for a colonoscopy.  He declines.  Stressed to him the importance of early detection.  He declines.  PSA check 05/18/12 - .74.  Follow.

## 2013-06-20 NOTE — Assessment & Plan Note (Signed)
Refer to dermatology for evaluation. 

## 2013-06-20 NOTE — Assessment & Plan Note (Signed)
Blood pressure under good control.  Follow.  Follow metabolic panel.    

## 2013-06-20 NOTE — Assessment & Plan Note (Signed)
Sees cardiology.  Currently asymptomatic.  Had stress echo 03/26/09 that was normal without evidence of ischemia.  EF 60-70%.  Continue aggressive risk factor modification.    

## 2013-06-21 ENCOUNTER — Encounter: Payer: Self-pay | Admitting: Emergency Medicine

## 2013-09-24 ENCOUNTER — Other Ambulatory Visit: Payer: Self-pay | Admitting: Internal Medicine

## 2013-10-22 ENCOUNTER — Other Ambulatory Visit: Payer: Self-pay | Admitting: Internal Medicine

## 2013-10-27 ENCOUNTER — Other Ambulatory Visit (INDEPENDENT_AMBULATORY_CARE_PROVIDER_SITE_OTHER): Payer: Medicare Other

## 2013-10-27 DIAGNOSIS — E7800 Pure hypercholesterolemia, unspecified: Secondary | ICD-10-CM

## 2013-10-27 DIAGNOSIS — R739 Hyperglycemia, unspecified: Secondary | ICD-10-CM

## 2013-10-27 DIAGNOSIS — R7309 Other abnormal glucose: Secondary | ICD-10-CM

## 2013-10-27 DIAGNOSIS — R5383 Other fatigue: Secondary | ICD-10-CM

## 2013-10-27 DIAGNOSIS — R5381 Other malaise: Secondary | ICD-10-CM

## 2013-10-27 DIAGNOSIS — I10 Essential (primary) hypertension: Secondary | ICD-10-CM

## 2013-10-27 DIAGNOSIS — Z125 Encounter for screening for malignant neoplasm of prostate: Secondary | ICD-10-CM

## 2013-10-27 LAB — BASIC METABOLIC PANEL
BUN: 17 mg/dL (ref 6–23)
CALCIUM: 9.1 mg/dL (ref 8.4–10.5)
CO2: 31 meq/L (ref 19–32)
CREATININE: 1.2 mg/dL (ref 0.4–1.5)
Chloride: 101 mEq/L (ref 96–112)
GFR: 62.49 mL/min (ref >60.00)
Glucose, Bld: 117 mg/dL — ABNORMAL HIGH (ref 70–99)
Potassium: 3.8 mEq/L (ref 3.5–5.1)
Sodium: 139 mEq/L (ref 135–145)

## 2013-10-27 LAB — HEMOGLOBIN A1C: HEMOGLOBIN A1C: 6.1 % (ref 4.6–6.5)

## 2013-10-27 LAB — TSH: TSH: 10.42 u[IU]/mL — ABNORMAL HIGH (ref 0.35–4.50)

## 2013-10-27 LAB — LIPID PANEL
CHOLESTEROL: 362 mg/dL — AB (ref 0–200)
HDL: 32 mg/dL — ABNORMAL LOW (ref >39.00)
LDL Cholesterol: 296 mg/dL — ABNORMAL HIGH (ref 0–99)
NonHDL: 330
Total CHOL/HDL Ratio: 11
Triglycerides: 169 mg/dL — ABNORMAL HIGH (ref 0.0–149.0)
VLDL: 33.8 mg/dL (ref 0.0–40.0)

## 2013-10-27 LAB — HEPATIC FUNCTION PANEL
ALT: 8 U/L (ref 0–53)
AST: 15 U/L (ref 0–37)
Albumin: 3.5 g/dL (ref 3.5–5.2)
Alkaline Phosphatase: 68 U/L (ref 39–117)
Bilirubin, Direct: 0.1 mg/dL (ref 0.0–0.3)
TOTAL PROTEIN: 5.9 g/dL — AB (ref 6.0–8.3)
Total Bilirubin: 0.6 mg/dL (ref 0.2–1.2)

## 2013-10-27 LAB — PSA, MEDICARE: PSA: 2.14 ng/ml (ref 0.10–4.00)

## 2013-11-03 ENCOUNTER — Ambulatory Visit (INDEPENDENT_AMBULATORY_CARE_PROVIDER_SITE_OTHER): Payer: Medicare Other | Admitting: Internal Medicine

## 2013-11-03 ENCOUNTER — Encounter: Payer: Self-pay | Admitting: Internal Medicine

## 2013-11-03 VITALS — BP 120/80 | HR 64 | Temp 97.9°F | Ht 68.5 in | Wt 162.2 lb

## 2013-11-03 DIAGNOSIS — I10 Essential (primary) hypertension: Secondary | ICD-10-CM

## 2013-11-03 DIAGNOSIS — E039 Hypothyroidism, unspecified: Secondary | ICD-10-CM

## 2013-11-03 DIAGNOSIS — E7800 Pure hypercholesterolemia, unspecified: Secondary | ICD-10-CM

## 2013-11-03 DIAGNOSIS — N200 Calculus of kidney: Secondary | ICD-10-CM

## 2013-11-03 DIAGNOSIS — R7309 Other abnormal glucose: Secondary | ICD-10-CM

## 2013-11-03 DIAGNOSIS — I2510 Atherosclerotic heart disease of native coronary artery without angina pectoris: Secondary | ICD-10-CM

## 2013-11-03 DIAGNOSIS — Z23 Encounter for immunization: Secondary | ICD-10-CM

## 2013-11-03 DIAGNOSIS — I729 Aneurysm of unspecified site: Secondary | ICD-10-CM

## 2013-11-03 DIAGNOSIS — R739 Hyperglycemia, unspecified: Secondary | ICD-10-CM

## 2013-11-03 DIAGNOSIS — G459 Transient cerebral ischemic attack, unspecified: Secondary | ICD-10-CM

## 2013-11-03 MED ORDER — LEVOTHYROXINE SODIUM 50 MCG PO TABS: 50.0000 ug | ORAL_TABLET | Freq: Every day | ORAL | Status: AC

## 2013-11-03 NOTE — Progress Notes (Signed)
Pre visit review using our clinic review tool, if applicable. No additional management support is needed unless otherwise documented below in the visit note. 

## 2013-11-03 NOTE — Patient Instructions (Signed)
Start synthroid - one per day.  Take in the morning with water.  Do not eat, drink or take any other medications for at least 30 min - 1 hour.

## 2013-11-03 NOTE — Progress Notes (Signed)
Subjective:    Patient ID: Hunter Booth, male    DOB: [birth year], 80 y.o.   MRN: [id number]  HPI 80 year old male with past history of hypertension, hypercholesterolemia, previous TIA and known CAD with previous hospitalization for inferior MI.  He comes in today to follow up on these issues as well as for a complete physical exam. States he is doing well.  Denies any chest pain or tightness with increased activity or exertion.  Stays busy and active. Eating and drinking well.  Trying to watch what he eats.  No bowel change.  Has cut down on his smoking.  Only smoking 2 cigarettes/day.  Discussed the need to quit.  He prefers to continue to cut down on his own.   Has adjusted his diet.  Handling stress well.  Still dealing with his wife medical issues. Overall he feels good.  Has been off his cholesterol medication.  Cholesterol elevated.  Plans to restart.       Past Medical History  Diagnosis Date  . Hypertension   . Hypercholesterolemia   . Nephrolithiasis   . Hyperglycemia   . CAD (coronary artery disease)     status post MI  . TIA (transient ischemic attack)   . Nephrolithiasis     Current Outpatient Prescriptions on File Prior to Visit  Medication Sig Dispense Refill  . aspirin 325 MG EC tablet Take 325 mg by mouth daily.      . clopidogrel (PLAVIX) 75 MG tablet TAKE ONE (1) TABLET EACH DAY  30 tablet  2  . CRESTOR 20 MG tablet TAKE ONE (1) TABLET EACH DAY  90 tablet  1  . felodipine (PLENDIL) 5 MG 24 hr tablet TAKE ONE TABLET TWICE DAILY  60 tablet  5  . hydrochlorothiazide (HYDRODIURIL) 25 MG tablet TAKE ONE (1) TABLET EACH DAY  90 tablet  1  . lisinopril (PRINIVIL,ZESTRIL) 40 MG tablet TAKE ONE (1) TABLET EACH DAY  90 tablet  1  . metoprolol succinate (TOPROL-XL) 50 MG 24 hr tablet TAKE ONE TABLET TWICE DAILY  180 tablet  1  . potassium chloride (K-DUR) 10 MEQ tablet TAKE ONE (1) TABLET EACH DAY  90 tablet  1   No current facility-administered medications on file prior to  visit.    Review of Systems Patient denies any headache, lightheadedness or dizziness.  No sinus or allergy symptoms.  No chest pain, tightness or palpitations.  No increased shortness of breath, cough or congestion.  No nausea or vomiting.  No acid reflux.  No abdominal pain or cramping.  No bowel change, such as diarrhea, constipation, BRBPR or melana.  No urine change.  Overall he feels he is doing well.  Feels he is handling the stress of his wife's medical issues relatively well.  Eye is doing well.  S/p surgery.       Objective:   Physical Exam  Filed Vitals:   11/03/13 1036  BP: 120/80  Pulse: 64  Temp: 97.9 F (36.6 C)   Blood pressure recheck:  28/42  80 year old male in no acute distress.  HEENT:  Nares - clear.  Oropharynx - without lesions. NECK:  Supple.  Nontender.  No audible carotid bruit.  HEART:  Appears to be regular.   LUNGS:  No crackles or wheezing audible.  Respirations even and unlabored.   RADIAL PULSE:  Equal bilaterally.  ABDOMEN:  Soft.  Nontender.  Bowel sounds present and normal.  No audible abdominal bruit.  GU:  Normal descended testicles.  No palpable testicular nodules.   RECTAL:  Could not appreciate any palpable prostate nodules.  Heme negative.   EXTREMITIES:  No increased edema present.  DP pulses palpable and equal bilaterally.     FEET:  No lesions.      Assessment & Plan:  PULMONARY.  Breathing stable. Continues to smoke.  Has cut down.  Follow.  See above.  Discussed the need to quit.  Wants to continue to quit on his own.  Discussed chest CT given his years of smoking.    HEALTH MAINTENANCE.  Physical today.  I again talked with him regarding the need for a colonoscopy.  He declines.  Stressed to him the importance of early detection.  He declines.  PSA  2.14 10/27/13.  Increased from previous check.  Will recheck psa with next labs.

## 2013-11-06 ENCOUNTER — Encounter: Payer: Self-pay | Admitting: Internal Medicine

## 2013-11-06 DIAGNOSIS — E039 Hypothyroidism, unspecified: Secondary | ICD-10-CM | POA: Insufficient documentation

## 2013-11-06 NOTE — Assessment & Plan Note (Signed)
Maintained on aspirin and Plavix.  Currently asymptomatic.  Continue risk factor modification.   States cardiology following his carotids.  Follow.

## 2013-11-06 NOTE — Assessment & Plan Note (Signed)
Blood pressure under good control.  Follow.  Follow metabolic panel.

## 2013-11-06 NOTE — Assessment & Plan Note (Addendum)
Has been off crestor.  Cholesterol elevated.  Restart crestor.  Low cholesterol diet and exercise.  Also needs to quit smoking.  Working on diet and exercise.  Follow.

## 2013-11-06 NOTE — Assessment & Plan Note (Signed)
Sees cardiology.  Currently asymptomatic.  Had stress echo 03/26/09 that was normal without evidence of ischemia.  EF 60-70%.  Continue aggressive risk factor modification.

## 2013-11-06 NOTE — Assessment & Plan Note (Signed)
Has a history of an infrarenal abdominal aortic aneurysm and a 1.90mm right iliac aneurysm.  Was following with Dr Hulda Humphrey.  Now seeing Dr Ronalee Belts.  Last evaluated in 4/15.  Recommended yearly f/u.

## 2013-11-06 NOTE — Assessment & Plan Note (Signed)
Elevated tsh.  Discussed with him today.  Start synthroid 73mcg q day.  Recheck tsh in 6 weeks.

## 2013-11-06 NOTE — Assessment & Plan Note (Signed)
Low carb diet and exercise.  Follow met b and a1c.   

## 2013-11-06 NOTE — Assessment & Plan Note (Signed)
Sees Dr Jacqlyn Larsen.  Currently asymptomatic.

## 2013-11-10 ENCOUNTER — Other Ambulatory Visit: Payer: Self-pay | Admitting: Internal Medicine

## 2013-11-16 ENCOUNTER — Other Ambulatory Visit (INDEPENDENT_AMBULATORY_CARE_PROVIDER_SITE_OTHER): Payer: Medicare Other

## 2013-11-16 ENCOUNTER — Other Ambulatory Visit: Payer: Self-pay | Admitting: Internal Medicine

## 2013-11-16 DIAGNOSIS — E039 Hypothyroidism, unspecified: Secondary | ICD-10-CM

## 2013-11-16 LAB — TSH: TSH: 5.08 u[IU]/mL — AB (ref 0.35–4.50)

## 2013-11-16 NOTE — Progress Notes (Signed)
Order placed for f/u tsh.  

## 2013-11-17 ENCOUNTER — Emergency Department: Payer: Self-pay | Admitting: Emergency Medicine

## 2013-11-17 ENCOUNTER — Telehealth: Payer: Self-pay | Admitting: *Deleted

## 2013-11-17 ENCOUNTER — Encounter: Payer: Self-pay | Admitting: *Deleted

## 2013-11-17 LAB — BASIC METABOLIC PANEL
Anion Gap: 6 — ABNORMAL LOW (ref 7–16)
BUN: 19 mg/dL — ABNORMAL HIGH (ref 7–18)
CALCIUM: 8.4 mg/dL — AB (ref 8.5–10.1)
CO2: 30 mmol/L (ref 21–32)
Chloride: 105 mmol/L (ref 98–107)
Creatinine: 1.27 mg/dL (ref 0.60–1.30)
EGFR (African American): >60
EGFR (Non-African Amer.): 53 — ABNORMAL LOW
GLUCOSE: 92 mg/dL (ref 65–99)
OSMOLALITY: 283 (ref 275–301)
POTASSIUM: 3.3 mmol/L — AB (ref 3.5–5.1)
Sodium: 141 mmol/L (ref 136–145)

## 2013-11-17 LAB — CBC WITH DIFFERENTIAL/PLATELET
BASOS ABS: 0.1 10*3/uL (ref 0.0–0.1)
Basophil %: 0.6 %
Eosinophil #: 0.1 10*3/uL (ref 0.0–0.7)
Eosinophil %: 1 %
HCT: 43.3 % (ref 40.0–52.0)
HGB: 14.6 g/dL (ref 13.0–18.0)
Lymphocyte #: 2.3 10*3/uL (ref 1.0–3.6)
Lymphocyte %: 25.2 %
MCH: 31.5 pg (ref 26.0–34.0)
MCHC: 33.8 g/dL (ref 32.0–36.0)
MCV: 93 fL (ref 80–100)
MONOS PCT: 11.9 %
Monocyte #: 1.1 x10 3/mm — ABNORMAL HIGH (ref 0.2–1.0)
Neutrophil #: 5.6 10*3/uL (ref 1.4–6.5)
Neutrophil %: 61.3 %
Platelet: 206 10*3/uL (ref 150–440)
RBC: 4.64 10*6/uL (ref 4.40–5.90)
RDW: 13.6 % (ref 11.5–14.5)
WBC: 9.2 10*3/uL (ref 3.8–10.6)

## 2013-11-17 LAB — URINALYSIS, COMPLETE
BLOOD: NEGATIVE
Bacteria: NONE SEEN
Bilirubin,UR: NEGATIVE
GLUCOSE, UR: NEGATIVE mg/dL (ref 0–75)
KETONE: NEGATIVE
Leukocyte Esterase: NEGATIVE
Nitrite: NEGATIVE
PH: 6 (ref 4.5–8.0)
PROTEIN: NEGATIVE
Specific Gravity: 1.018 (ref 1.003–1.030)
Squamous Epithelial: <1
WBC UR: <1 /HPF (ref 0–5)

## 2013-11-17 LAB — TROPONIN I

## 2013-11-17 NOTE — Telephone Encounter (Signed)
Pt and his daughter walked in the clinic requesting lab work.  Pt called his daughter this morning reported he felt funny, he couldn't explain it however he wanted help.  Further states he had felt this way for a couple of days since starting new thyroid medication.  States he felt exhausted, very forgetful.  EMS was dispatched to the home, EKG and vitals performed and normal per EMS paperwork. Pt refused ER transport.  Pt reports he has an appointment with cardiology in the morning.  Appointment scheduled with Dr Nicki Reaper on Monday 9.14.15 at 11:30am.

## 2013-11-17 NOTE — Telephone Encounter (Signed)
Called pt to get more information about what happened today.  States was feeling bad.  EMS called.  States everything checked out fine.  Refused to go to ER.  Daughter took him to [facility].  He refused to wait to be seen.  Feeling better now.  Vague symptoms this am and for the last two days.  States just didn't feel good and didn't feel right.  Denies confusion or any motor weakness.  His daughter reports that he was confused driving and giving directions, but states she is not sure that this is any different than how he normally is.  He refused to go to the ER now.  Has an appt with cardiology tomorrow.  Will keep this appt.  Did agree if he had any worsening symptoms or problems, he would be evaluated this pm.  ER notified.

## 2013-11-18 LAB — TSH: Thyroid Stimulating Horm: 6.62 u[IU]/mL — ABNORMAL HIGH

## 2013-11-21 ENCOUNTER — Ambulatory Visit (INDEPENDENT_AMBULATORY_CARE_PROVIDER_SITE_OTHER): Payer: Medicare Other | Admitting: Internal Medicine

## 2013-11-21 ENCOUNTER — Encounter: Payer: Self-pay | Admitting: Internal Medicine

## 2013-11-21 VITALS — BP 110/80 | HR 70 | Temp 98.1°F | Ht 68.5 in | Wt 158.5 lb

## 2013-11-21 DIAGNOSIS — R410 Disorientation, unspecified: Secondary | ICD-10-CM

## 2013-11-21 DIAGNOSIS — G459 Transient cerebral ischemic attack, unspecified: Secondary | ICD-10-CM

## 2013-11-21 DIAGNOSIS — F29 Unspecified psychosis not due to a substance or known physiological condition: Secondary | ICD-10-CM

## 2013-11-21 DIAGNOSIS — R7309 Other abnormal glucose: Secondary | ICD-10-CM

## 2013-11-21 DIAGNOSIS — I2510 Atherosclerotic heart disease of native coronary artery without angina pectoris: Secondary | ICD-10-CM

## 2013-11-21 DIAGNOSIS — E7800 Pure hypercholesterolemia, unspecified: Secondary | ICD-10-CM

## 2013-11-21 DIAGNOSIS — I10 Essential (primary) hypertension: Secondary | ICD-10-CM

## 2013-11-21 DIAGNOSIS — E039 Hypothyroidism, unspecified: Secondary | ICD-10-CM

## 2013-11-21 DIAGNOSIS — R739 Hyperglycemia, unspecified: Secondary | ICD-10-CM

## 2013-11-21 DIAGNOSIS — R413 Other amnesia: Secondary | ICD-10-CM

## 2013-11-21 NOTE — Patient Instructions (Signed)
Decrease your Toprol XL (metoprolol) to one tablet per day.

## 2013-11-21 NOTE — Progress Notes (Signed)
Pre visit review using our clinic review tool, if applicable. No additional management support is needed unless otherwise documented below in the visit note. 

## 2013-11-23 ENCOUNTER — Encounter: Payer: Self-pay | Admitting: Internal Medicine

## 2013-11-23 DIAGNOSIS — R410 Disorientation, unspecified: Secondary | ICD-10-CM | POA: Insufficient documentation

## 2013-11-23 DIAGNOSIS — R413 Other amnesia: Secondary | ICD-10-CM | POA: Insufficient documentation

## 2013-11-23 NOTE — Progress Notes (Signed)
Subjective:    Patient ID: Hunter Booth, male    DOB: [birth year], 80 y.o.   MRN: [id number]  HPI 80 year old male with past history of hypertension, hypercholesterolemia, previous TIA and known CAD with previous hospitalization for inferior MI.  He comes in today as a work in for f/u recent ER visit.  Noticed some increased fatigue and what appeared to be confusion.  Some memory change.  His daughter accompanies him today.  History obtained from both of them.  Was seen in the ER several days ago. Reports just not feeling well.  Daughter reports some confusion.  States had labs and no other testing.  Feels better today.  Still some fatigue, but overall better.   Denies any chest pain or tightness with increased activity or exertion.  No chest pain over the last several days.  Eating and drinking well.  Trying to watch what he eats.  No bowel change. Has cut down on his smoking.  Only smoking 2 cigarettes/day.  Have discussed the need to quit.   Has adjusted his diet.  Handling stress well.  Still dealing with his wife medical issues.        Past Medical History  Diagnosis Date  . Hypertension   . Hypercholesterolemia   . Nephrolithiasis   . Hyperglycemia   . CAD (coronary artery disease)     status post MI  . TIA (transient ischemic attack)   . Nephrolithiasis     Current Outpatient Prescriptions on File Prior to Visit  Medication Sig Dispense Refill  . aspirin 325 MG EC tablet Take 325 mg by mouth daily.      . clopidogrel (PLAVIX) 75 MG tablet TAKE ONE (1) TABLET EACH DAY  30 tablet  2  . CRESTOR 20 MG tablet TAKE ONE (1) TABLET EACH DAY  90 tablet  1  . felodipine (PLENDIL) 5 MG 24 hr tablet TAKE ONE TABLET TWICE DAILY  60 tablet  5  . hydrochlorothiazide (HYDRODIURIL) 25 MG tablet TAKE ONE (1) TABLET EACH DAY  90 tablet  1  . levothyroxine (SYNTHROID) 50 MCG tablet Take 1 tablet (50 mcg total) by mouth daily before breakfast.  30 tablet  2  . lisinopril (PRINIVIL,ZESTRIL) 40 MG tablet  TAKE ONE (1) TABLET EACH DAY  90 tablet  1  . metoprolol succinate (TOPROL-XL) 50 MG 24 hr tablet TAKE ONE TABLET TWICE DAILY  180 tablet  1  . potassium chloride (K-DUR) 10 MEQ tablet TAKE ONE (1) TABLET EACH DAY  90 tablet  1   No current facility-administered medications on file prior to visit.    Review of Systems Patient denies any headache, lightheadedness or dizziness.  No sinus or allergy symptoms.  No chest pain, tightness or palpitations.  No increased shortness of breath, cough or congestion.  No nausea or vomiting.  No acid reflux.  No abdominal pain or cramping.  No bowel change, such as diarrhea, constipation, BRBPR or melana.  No urine change.  Overall he feels he is doing well.  Feels he is handling the stress of his wife's medical issues relatively well.  Still with some fatigue, but overall feels better.  With some memory issues.      Objective:   Physical Exam  Filed Vitals:   11/21/13 1103  BP: 110/80  Pulse: 70  Temp: 98.1 F (36.7 C)   Blood pressure recheck:  120/70 lying and 120/68 standing  80 year old male in no acute distress.  HEENT:  Nares - clear.  Oropharynx - without lesions. NECK:  Supple.  Nontender.  No audible carotid bruit.  HEART:  Appears to be regular.   LUNGS:  No crackles or wheezing audible.  Respirations even and unlabored.   RADIAL PULSE:  Equal bilaterally.  ABDOMEN:  Soft.  Nontender.  Bowel sounds present and normal.  No audible abdominal bruit.   EXTREMITIES:  No increased edema present.  DP pulses palpable and equal bilaterally.     FEET:  No lesions.  MINI MENTAL STATUS:  Able to subtract 100-7, but not able to subtract 93-7.  Did not spell world backwards.  Unable to recall three objects.  Unable to give me the month.       Assessment & Plan:  PULMONARY.  Breathing stable. Continues to smoke.  Has cut down.  Follow.  See above.  Discussed the need to quit.  Wants to continue to quit on his own.  Discussed chest CT given his years  of smoking.  He has wanted to hold on CT.   HEALTH MAINTENANCE.  Physical last visit.  He has declined colonoscopy.   PSA  2.14 10/27/13.  Increased from previous check.  Will recheck psa with next labs.    I spent 25 minutes with the patient and more than 50% of the time was spent in consultation regarding the above.

## 2013-11-23 NOTE — Assessment & Plan Note (Signed)
Has been maintained on aspirin and Plavix.  With the acute change and confusion, concern over possible reoccurring TIA.  Continue risk factor modification.   States cardiology following his carotids.  Will have them recheck.  Check MRI brain.  Follow.

## 2013-11-23 NOTE — Assessment & Plan Note (Signed)
Sees cardiology.  Currently asymptomatic.  Had stress echo 03/26/09 that was normal without evidence of ischemia.  EF 60-70%.  Continue aggressive risk factor modification.  Just evaluated by cardiology.  Felt from a cardiac stand point - stable.

## 2013-11-23 NOTE — Assessment & Plan Note (Signed)
Had been off crestor.  Cholesterol elevated.  Restarted.  Taking now.  Low cholesterol diet and exercise.  Also needs to quit smoking.  Working on diet and exercise.  Follow.

## 2013-11-23 NOTE — Assessment & Plan Note (Signed)
Unclear if related to recent episode.  Does appear to have had some change.  Check MRI.  Follow.

## 2013-11-23 NOTE — Assessment & Plan Note (Signed)
Describes the episode recently with acute increased fatigue and just not feeling well.  Daughter reports confusion with directions, etc.  Obtain ER records.  Has known carotid disease.  Will recheck carotid ultrasound.  Schedule MRI (Brain).  Continue aspirin and plavix.  Mini mental status as outlined.

## 2013-11-23 NOTE — Assessment & Plan Note (Signed)
Low carb diet and exercise.  Follow met b and a1c.   

## 2013-11-23 NOTE — Assessment & Plan Note (Signed)
Blood pressure under good control.  Follow.  Follow metabolic panel.

## 2013-11-23 NOTE — Assessment & Plan Note (Signed)
Recent elevated tsh.  Started synthroid 41mcg q day.  Last visit.  Just had f/u tsh.  Better.  Follow .

## 2013-11-24 ENCOUNTER — Telehealth: Payer: Self-pay | Admitting: Internal Medicine

## 2013-11-24 ENCOUNTER — Other Ambulatory Visit: Payer: Self-pay | Admitting: Internal Medicine

## 2013-11-24 ENCOUNTER — Other Ambulatory Visit (INDEPENDENT_AMBULATORY_CARE_PROVIDER_SITE_OTHER): Payer: Medicare Other

## 2013-11-24 DIAGNOSIS — N289 Disorder of kidney and ureter, unspecified: Secondary | ICD-10-CM

## 2013-11-24 DIAGNOSIS — E039 Hypothyroidism, unspecified: Secondary | ICD-10-CM

## 2013-11-24 LAB — TSH: TSH: 3.16 u[IU]/mL (ref 0.35–4.50)

## 2013-11-24 NOTE — Telephone Encounter (Signed)
Please call pt and notify him that I would like to recheck his kidney function prior to his MRI.  Have him come in today or tomorrow for met b.  Thanks.  Make sure MRI not scheduled.

## 2013-11-24 NOTE — Progress Notes (Signed)
Order placed for met b 

## 2013-11-24 NOTE — Telephone Encounter (Signed)
Pt notified & coming for labs today.

## 2013-11-25 ENCOUNTER — Other Ambulatory Visit (INDEPENDENT_AMBULATORY_CARE_PROVIDER_SITE_OTHER): Payer: Medicare Other

## 2013-11-25 DIAGNOSIS — N289 Disorder of kidney and ureter, unspecified: Secondary | ICD-10-CM

## 2013-11-25 LAB — BASIC METABOLIC PANEL
BUN: 15 mg/dL (ref 6–23)
CALCIUM: 8.9 mg/dL (ref 8.4–10.5)
CHLORIDE: 101 meq/L (ref 96–112)
CO2: 26 mEq/L (ref 19–32)
Creatinine, Ser: 1.2 mg/dL (ref 0.4–1.5)
GFR: 60.14 mL/min (ref >60.00)
GLUCOSE: 109 mg/dL — AB (ref 70–99)
Potassium: 3.2 mEq/L — ABNORMAL LOW (ref 3.5–5.1)
Sodium: 139 mEq/L (ref 135–145)

## 2013-11-28 ENCOUNTER — Emergency Department: Payer: Self-pay | Admitting: Emergency Medicine

## 2013-11-28 ENCOUNTER — Ambulatory Visit: Payer: Self-pay | Admitting: Internal Medicine

## 2013-11-29 ENCOUNTER — Telehealth: Payer: Self-pay | Admitting: *Deleted

## 2013-11-29 ENCOUNTER — Other Ambulatory Visit: Payer: Self-pay | Admitting: Internal Medicine

## 2013-11-29 NOTE — Telephone Encounter (Signed)
Pts daughter Jackelyn Poling called states pt was admitted to [facility] last night.  Pt is being discharged today while he stops taking his Plavix for upcoming Biopsy of the brain next week.  Jackelyn Poling is requesting whether pt still needs to come in on Friday for Potassium redraw.  Please advise

## 2013-11-29 NOTE — Telephone Encounter (Signed)
Daughter came by office to drop off CD from [facility]. I told her to hang onto it in case another office needed it. I told her that [name] does not need to come in Friday & I will see if we can see his [facility] records from [place]. I also asked her to keep [place] updated.

## 2013-11-29 NOTE — Telephone Encounter (Signed)
If he had labs drawn yesterday, should not need to come in.  Need copy of labs.

## 2013-11-30 ENCOUNTER — Other Ambulatory Visit: Payer: Self-pay | Admitting: Internal Medicine

## 2013-11-30 NOTE — Telephone Encounter (Signed)
Records requested

## 2013-11-30 NOTE — Telephone Encounter (Signed)
There are no recent labs in Care Everywhere.  Please call [facility] and see if can get results and ER records.  Thanks.

## 2013-12-01 NOTE — Telephone Encounter (Signed)
Faxed second request this morning

## 2013-12-02 ENCOUNTER — Other Ambulatory Visit: Payer: Medicare Other

## 2013-12-05 NOTE — Telephone Encounter (Signed)
Received auth form & request for records refaxed

## 2013-12-05 NOTE — Telephone Encounter (Signed)
Spoke with daughter & she will stop by to get release form signed

## 2013-12-05 NOTE — Telephone Encounter (Signed)
Received a fax stating that records could not be released without first receiving a signed & dated authorization from patient

## 2013-12-06 NOTE — Telephone Encounter (Signed)
refaxed request again

## 2013-12-08 ENCOUNTER — Telehealth: Payer: Self-pay

## 2013-12-08 NOTE — Telephone Encounter (Signed)
The patient's daughter called and is hoping to get the patient worked in for having his stiches removed.  She stated the dr at [facility] suggested this be done around the 12th?   Do you want him worked in?

## 2013-12-09 NOTE — Telephone Encounter (Addendum)
Scheduled appt 12/19/13 @ 3:30. Attempted to reach patient, no answer. Will try back

## 2013-12-09 NOTE — Telephone Encounter (Signed)
I Can come in on 12/19/13 for suture removal and see if we can remove them here.   Make sure have suture removal kit.  Thanks

## 2013-12-09 NOTE — Telephone Encounter (Signed)
Spoke with daughter & notified her of appt.

## 2013-12-15 ENCOUNTER — Other Ambulatory Visit: Payer: Medicare Other

## 2013-12-19 ENCOUNTER — Ambulatory Visit (INDEPENDENT_AMBULATORY_CARE_PROVIDER_SITE_OTHER): Payer: Medicare Other | Admitting: Internal Medicine

## 2013-12-19 ENCOUNTER — Encounter: Payer: Self-pay | Admitting: Internal Medicine

## 2013-12-19 VITALS — BP 110/70 | HR 79 | Temp 98.5°F | Ht 68.5 in | Wt 151.5 lb

## 2013-12-19 DIAGNOSIS — I10 Essential (primary) hypertension: Secondary | ICD-10-CM

## 2013-12-19 DIAGNOSIS — C719 Malignant neoplasm of brain, unspecified: Secondary | ICD-10-CM

## 2013-12-19 DIAGNOSIS — G459 Transient cerebral ischemic attack, unspecified: Secondary | ICD-10-CM

## 2013-12-19 DIAGNOSIS — I2510 Atherosclerotic heart disease of native coronary artery without angina pectoris: Secondary | ICD-10-CM

## 2013-12-19 NOTE — Progress Notes (Signed)
Pre visit review using our clinic review tool, if applicable. No additional management support is needed unless otherwise documented below in the visit note. 

## 2013-12-21 ENCOUNTER — Ambulatory Visit: Payer: Self-pay | Admitting: Radiation Oncology

## 2013-12-23 ENCOUNTER — Encounter: Payer: Self-pay | Admitting: Internal Medicine

## 2013-12-23 LAB — COMPREHENSIVE METABOLIC PANEL
AST: 14 U/L — AB (ref 15–37)
Albumin: 3.6 g/dL (ref 3.4–5.0)
Alkaline Phosphatase: 69 U/L
Anion Gap: 5 — ABNORMAL LOW (ref 7–16)
BUN: 17 mg/dL (ref 7–18)
Bilirubin,Total: 0.8 mg/dL (ref 0.2–1.0)
CALCIUM: 8.9 mg/dL (ref 8.5–10.1)
CO2: 31 mmol/L (ref 21–32)
CREATININE: 1.02 mg/dL (ref 0.60–1.30)
Chloride: 103 mmol/L (ref 98–107)
EGFR (Non-African Amer.): >60
Glucose: 105 mg/dL — ABNORMAL HIGH (ref 65–99)
Osmolality: 279 (ref 275–301)
Potassium: 3.5 mmol/L (ref 3.5–5.1)
SGPT (ALT): 18 U/L
SODIUM: 139 mmol/L (ref 136–145)
Total Protein: 6.8 g/dL (ref 6.4–8.2)

## 2013-12-23 LAB — CBC CANCER CENTER
BASOS ABS: 0.1 x10 3/mm (ref 0.0–0.1)
BASOS PCT: 0.9 %
Eosinophil #: 0.1 x10 3/mm (ref 0.0–0.7)
Eosinophil %: 0.6 %
HCT: 43.6 % (ref 40.0–52.0)
HGB: 14.2 g/dL (ref 13.0–18.0)
LYMPHS ABS: 1.9 x10 3/mm (ref 1.0–3.6)
LYMPHS PCT: 21.3 %
MCH: 31 pg (ref 26.0–34.0)
MCHC: 32.6 g/dL (ref 32.0–36.0)
MCV: 95 fL (ref 80–100)
Monocyte #: 0.7 x10 3/mm (ref 0.2–1.0)
Monocyte %: 8.2 %
Neutrophil #: 6.2 x10 3/mm (ref 1.4–6.5)
Neutrophil %: 69 %
Platelet: 185 x10 3/mm (ref 150–440)
RBC: 4.58 10*6/uL (ref 4.40–5.90)
RDW: 13.3 % (ref 11.5–14.5)
WBC: 9 x10 3/mm (ref 3.8–10.6)

## 2013-12-23 LAB — APTT: Activated PTT: 28.1 secs (ref 23.6–35.9)

## 2013-12-23 LAB — PROTIME-INR
INR: 1
PROTHROMBIN TIME: 13.5 s (ref 11.5–14.7)

## 2013-12-25 ENCOUNTER — Other Ambulatory Visit: Payer: Self-pay | Admitting: Internal Medicine

## 2013-12-25 DIAGNOSIS — C719 Malignant neoplasm of brain, unspecified: Secondary | ICD-10-CM | POA: Insufficient documentation

## 2013-12-25 DIAGNOSIS — R2681 Unsteadiness on feet: Secondary | ICD-10-CM

## 2013-12-25 NOTE — Assessment & Plan Note (Signed)
Has been maintained on aspirin and Plavix.   Now just on aspirin.  Follow.

## 2013-12-25 NOTE — Progress Notes (Signed)
Subjective:    Patient ID: Hunter Booth, male    DOB: Jan 11, 1932, 80 y.o.   MRN: [id number]  Suture / Staple Removal  80 year old male with past history of hypertension, hypercholesterolemia, previous TIA and known CAD with previous hospitalization for inferior MI.  His daughter accompanies him today.  History obtained from both of them.  Was recently diagnosed with a brain tumor.  Being followed by neurosurgery at [facility].  Planning for XRT and chemo.  Still some fatigue, but overall better.   Denies any chest pain or tightness with increased activity or exertion.  Eating and drinking.  Daughter reports not eating as much.  No nausea or vomiting.    No bowel change.  Some gait instability.         Past Medical History  Diagnosis Date  . Hypertension   . Hypercholesterolemia   . Nephrolithiasis   . Hyperglycemia   . CAD (coronary artery disease)     status post MI  . TIA (transient ischemic attack)   . Nephrolithiasis     Current Outpatient Prescriptions on File Prior to Visit  Medication Sig Dispense Refill  . CRESTOR 20 MG tablet TAKE ONE (1) TABLET EACH DAY  90 tablet  1  . felodipine (PLENDIL) 5 MG 24 hr tablet TAKE ONE TABLET TWICE DAILY  60 tablet  5  . hydrochlorothiazide (HYDRODIURIL) 25 MG tablet TAKE ONE (1) TABLET EACH DAY  90 tablet  1  . levothyroxine (SYNTHROID) 50 MCG tablet Take 1 tablet (50 mcg total) by mouth daily before breakfast.  30 tablet  2  . lisinopril (PRINIVIL,ZESTRIL) 40 MG tablet TAKE ONE (1) TABLET EACH DAY  90 tablet  1  . metoprolol succinate (TOPROL-XL) 50 MG 24 hr tablet TAKE ONE TABLET TWICE DAILY  180 tablet  1  . potassium chloride (K-DUR) 10 MEQ tablet TAKE ONE (1) TABLET EACH DAY  90 tablet  1   No current facility-administered medications on file prior to visit.    Review of Systems Patient denies any headache, lightheadedness or dizziness.  No sinus or allergy symptoms.  No chest pain, tightness or palpitations.  No increased shortness of  breath, cough or congestion.  No nausea or vomiting.  No acid reflux.  No abdominal pain or cramping.  No bowel change, such as diarrhea, constipation, BRBPR or melana.  Some increased fatigue.  Feels overall he is handling things relatively well.  Unsteady gait.       Objective:   Physical Exam  Filed Vitals:   12/19/13 1528  BP: 110/70  Pulse: 79  Temp: 98.5 F (36.9 C)   Blood pressure recheck:  70/80  80 year old male in no acute distress.  HEENT:  Nares - clear.  Oropharynx - without lesions.  2 sutures removed scalp.  NECK:  Supple.  Nontender.  No audible carotid bruit.  HEART:  Appears to be regular.   LUNGS:  No crackles or wheezing audible.  Respirations even and unlabored.   EXTREMITIES:  No increased edema present.  DP pulses palpable and equal bilaterally.        Assessment & Plan:  PULMONARY.  Breathing stable. Continues to smoke.  Has cut down.  Follow.  See above.  Have discussed the need to quit.   HEALTH MAINTENANCE.   He has declined colonoscopy.   PSA  2.14 10/27/13.  Increased from previous check.  Will recheck psa with next labs.  Problem List Items Addressed This Visit  CAD (coronary artery disease) - Primary     Sees cardiology.  Currently asymptomatic.  Had stress echo 03/26/09 that was normal without evidence of ischemia.  EF 60-70%.  Continue aggressive risk factor modification.  Just evaluated by cardiology.  Felt from a cardiac stand point - stable.       Relevant Medications      aspirin 81 MG tablet   Glioblastoma     Recently diagnosed with a brain tumor.  Discussed with the patient and his daughter at length today.  Planning for XRT and chemo.  Being followed at [facility].  Sutures removed.       Relevant Medications      aspirin 81 MG tablet   Hypertension     Blood pressure under good control.  Follow.  Follow metabolic panel.       Relevant Medications      aspirin 81 MG tablet   TIA (transient ischemic attack)     Has been maintained on  aspirin and Plavix.   Now just on aspirin.  Follow.      Relevant Medications      aspirin 81 MG tablet     I spent 25 minutes with the patient and more than 50% of the time was spent in consultation regarding the above.

## 2013-12-25 NOTE — Progress Notes (Signed)
Order placed for home health

## 2013-12-25 NOTE — Assessment & Plan Note (Signed)
Blood pressure under good control.  Follow.  Follow metabolic panel.

## 2013-12-25 NOTE — Assessment & Plan Note (Signed)
Recently diagnosed with a brain tumor.  Discussed with the patient and his daughter at length today.  Planning for XRT and chemo.  Being followed at [facility].  Sutures removed.

## 2013-12-25 NOTE — Assessment & Plan Note (Signed)
Sees cardiology.  Currently asymptomatic.  Had stress echo 03/26/09 that was normal without evidence of ischemia.  EF 60-70%.  Continue aggressive risk factor modification.  Just evaluated by cardiology.  Felt from a cardiac stand point - stable.

## 2013-12-27 ENCOUNTER — Other Ambulatory Visit: Payer: Self-pay | Admitting: Internal Medicine

## 2014-01-08 ENCOUNTER — Ambulatory Visit: Payer: Self-pay | Admitting: Radiation Oncology

## 2014-01-16 LAB — BASIC METABOLIC PANEL
Anion Gap: 5 — ABNORMAL LOW (ref 7–16)
BUN: 29 mg/dL — ABNORMAL HIGH (ref 7–18)
CO2: 32 mmol/L (ref 21–32)
CREATININE: 1.25 mg/dL (ref 0.60–1.30)
Calcium, Total: 8.5 mg/dL (ref 8.5–10.1)
Chloride: 103 mmol/L (ref 98–107)
EGFR (African American): >60
EGFR (Non-African Amer.): 59 — ABNORMAL LOW
GLUCOSE: 141 mg/dL — AB (ref 65–99)
Osmolality: 288 (ref 275–301)
Potassium: 4.3 mmol/L (ref 3.5–5.1)
Sodium: 140 mmol/L (ref 136–145)

## 2014-01-16 LAB — CBC CANCER CENTER
BASOS ABS: 0 x10 3/mm (ref 0.0–0.1)
Basophil %: 0.3 %
EOS PCT: 0.3 %
Eosinophil #: 0 x10 3/mm (ref 0.0–0.7)
HCT: 42.4 % (ref 40.0–52.0)
HGB: 14 g/dL (ref 13.0–18.0)
LYMPHS ABS: 1.8 x10 3/mm (ref 1.0–3.6)
LYMPHS PCT: 13.3 %
MCH: 31.9 pg (ref 26.0–34.0)
MCHC: 33 g/dL (ref 32.0–36.0)
MCV: 97 fL (ref 80–100)
MONOS PCT: 5.6 %
Monocyte #: 0.8 x10 3/mm (ref 0.2–1.0)
NEUTROS ABS: 10.8 x10 3/mm — AB (ref 1.4–6.5)
NEUTROS PCT: 80.5 %
PLATELETS: 150 x10 3/mm (ref 150–440)
RBC: 4.38 10*6/uL — ABNORMAL LOW (ref 4.40–5.90)
RDW: 14.1 % (ref 11.5–14.5)
WBC: 13.4 x10 3/mm — ABNORMAL HIGH (ref 3.8–10.6)

## 2014-01-23 LAB — BASIC METABOLIC PANEL
Anion Gap: 5 — ABNORMAL LOW (ref 7–16)
BUN: 28 mg/dL — ABNORMAL HIGH (ref 7–18)
CO2: 30 mmol/L (ref 21–32)
Calcium, Total: 8.1 mg/dL — ABNORMAL LOW (ref 8.5–10.1)
Chloride: 105 mmol/L (ref 98–107)
Creatinine: 1.26 mg/dL (ref 0.60–1.30)
EGFR (Non-African Amer.): 59 — ABNORMAL LOW
GLUCOSE: 153 mg/dL — AB (ref 65–99)
Osmolality: 288 (ref 275–301)
POTASSIUM: 3.7 mmol/L (ref 3.5–5.1)
SODIUM: 140 mmol/L (ref 136–145)

## 2014-01-23 LAB — CBC CANCER CENTER
BASOS ABS: 0 x10 3/mm (ref 0.0–0.1)
Basophil %: 0.4 %
EOS ABS: 0 x10 3/mm (ref 0.0–0.7)
EOS PCT: 0.2 %
HCT: 41.7 % (ref 40.0–52.0)
HGB: 13.9 g/dL (ref 13.0–18.0)
LYMPHS PCT: 16 %
Lymphocyte #: 1.7 x10 3/mm (ref 1.0–3.6)
MCH: 32.4 pg (ref 26.0–34.0)
MCHC: 33.4 g/dL (ref 32.0–36.0)
MCV: 97 fL (ref 80–100)
Monocyte #: 0.8 x10 3/mm (ref 0.2–1.0)
Monocyte %: 7.2 %
NEUTROS PCT: 76.2 %
Neutrophil #: 8.1 x10 3/mm — ABNORMAL HIGH (ref 1.4–6.5)
Platelet: 169 x10 3/mm (ref 150–440)
RBC: 4.3 10*6/uL — ABNORMAL LOW (ref 4.40–5.90)
RDW: 14.7 % — ABNORMAL HIGH (ref 11.5–14.5)
WBC: 10.7 x10 3/mm — AB (ref 3.8–10.6)

## 2014-01-23 LAB — HEPATIC FUNCTION PANEL A (ARMC)
ALK PHOS: 62 U/L
Albumin: 2.9 g/dL — ABNORMAL LOW (ref 3.4–5.0)
Bilirubin, Direct: <0.1 mg/dL (ref 0.0–0.2)
Bilirubin,Total: 0.4 mg/dL (ref 0.2–1.0)
SGOT(AST): 22 U/L (ref 15–37)
SGPT (ALT): 50 U/L
TOTAL PROTEIN: 5.6 g/dL — AB (ref 6.4–8.2)

## 2014-01-31 ENCOUNTER — Ambulatory Visit: Payer: Self-pay | Admitting: Vascular Surgery

## 2014-01-31 LAB — BASIC METABOLIC PANEL
Anion Gap: 7 (ref 7–16)
BUN: 30 mg/dL — ABNORMAL HIGH (ref 7–18)
CHLORIDE: 103 mmol/L (ref 98–107)
CO2: 30 mmol/L (ref 21–32)
CREATININE: 1.16 mg/dL (ref 0.60–1.30)
Calcium, Total: 8.3 mg/dL — ABNORMAL LOW (ref 8.5–10.1)
EGFR (Non-African Amer.): >60
Glucose: 101 mg/dL — ABNORMAL HIGH (ref 65–99)
Osmolality: 286 (ref 275–301)
Potassium: 4.1 mmol/L (ref 3.5–5.1)
SODIUM: 140 mmol/L (ref 136–145)

## 2014-02-06 ENCOUNTER — Inpatient Hospital Stay: Payer: Self-pay | Admitting: Internal Medicine

## 2014-02-06 LAB — CBC WITH DIFFERENTIAL/PLATELET
BASOS ABS: 0 10*3/uL (ref 0.0–0.1)
Basophil %: 0.1 %
EOS PCT: 0.1 %
Eosinophil #: 0 10*3/uL (ref 0.0–0.7)
HCT: 43.9 % (ref 40.0–52.0)
HGB: 14.6 g/dL (ref 13.0–18.0)
Lymphocyte #: 1.1 10*3/uL (ref 1.0–3.6)
Lymphocyte %: 9.3 %
MCH: 32.1 pg (ref 26.0–34.0)
MCHC: 33.2 g/dL (ref 32.0–36.0)
MCV: 97 fL (ref 80–100)
MONO ABS: 1 x10 3/mm (ref 0.2–1.0)
Monocyte %: 8.1 %
Neutrophil #: 10.1 10*3/uL — ABNORMAL HIGH (ref 1.4–6.5)
Neutrophil %: 82.4 %
Platelet: 142 10*3/uL — ABNORMAL LOW (ref 150–440)
RBC: 4.55 10*6/uL (ref 4.40–5.90)
RDW: 14.5 % (ref 11.5–14.5)
WBC: 12.3 10*3/uL — ABNORMAL HIGH (ref 3.8–10.6)

## 2014-02-06 LAB — COMPREHENSIVE METABOLIC PANEL
ALBUMIN: 3.2 g/dL — AB (ref 3.4–5.0)
ALK PHOS: 89 U/L
ANION GAP: 6 — AB (ref 7–16)
BUN: 44 mg/dL — ABNORMAL HIGH (ref 7–18)
Bilirubin,Total: 1 mg/dL (ref 0.2–1.0)
CREATININE: 1.29 mg/dL (ref 0.60–1.30)
Calcium, Total: 8.9 mg/dL (ref 8.5–10.1)
Chloride: 100 mmol/L (ref 98–107)
Co2: 31 mmol/L (ref 21–32)
EGFR (African American): >60
EGFR (Non-African Amer.): 57 — ABNORMAL LOW
Glucose: 128 mg/dL — ABNORMAL HIGH (ref 65–99)
Osmolality: 287 (ref 275–301)
Potassium: 3.5 mmol/L (ref 3.5–5.1)
SGOT(AST): 15 U/L (ref 15–37)
SGPT (ALT): 49 U/L
Sodium: 137 mmol/L (ref 136–145)
TOTAL PROTEIN: 6.3 g/dL — AB (ref 6.4–8.2)

## 2014-02-07 ENCOUNTER — Ambulatory Visit: Payer: Self-pay | Admitting: Radiation Oncology

## 2014-02-07 LAB — CBC WITH DIFFERENTIAL/PLATELET
BASOS PCT: 0.1 %
Basophil #: 0 10*3/uL (ref 0.0–0.1)
EOS ABS: 0 10*3/uL (ref 0.0–0.7)
Eosinophil %: 0.1 %
HCT: 38.3 % — ABNORMAL LOW (ref 40.0–52.0)
HGB: 13 g/dL (ref 13.0–18.0)
LYMPHS ABS: 1.4 10*3/uL (ref 1.0–3.6)
LYMPHS PCT: 13.9 %
MCH: 32.6 pg (ref 26.0–34.0)
MCHC: 34 g/dL (ref 32.0–36.0)
MCV: 96 fL (ref 80–100)
MONOS PCT: 10.7 %
Monocyte #: 1 x10 3/mm (ref 0.2–1.0)
NEUTROS ABS: 7.3 10*3/uL — AB (ref 1.4–6.5)
NEUTROS PCT: 75.2 %
PLATELETS: 109 10*3/uL — AB (ref 150–440)
RBC: 4 10*6/uL — AB (ref 4.40–5.90)
RDW: 15.1 % — AB (ref 11.5–14.5)
WBC: 9.7 10*3/uL (ref 3.8–10.6)

## 2014-02-07 LAB — BASIC METABOLIC PANEL
Anion Gap: 8 (ref 7–16)
BUN: 38 mg/dL — ABNORMAL HIGH (ref 7–18)
CALCIUM: 8.1 mg/dL — AB (ref 8.5–10.1)
CO2: 29 mmol/L (ref 21–32)
CREATININE: 1.32 mg/dL — AB (ref 0.60–1.30)
Chloride: 101 mmol/L (ref 98–107)
GFR CALC NON AF AMER: 55 — AB
GLUCOSE: 103 mg/dL — AB (ref 65–99)
Osmolality: 285 (ref 275–301)
Potassium: 3.3 mmol/L — ABNORMAL LOW (ref 3.5–5.1)
Sodium: 138 mmol/L (ref 136–145)

## 2014-02-09 LAB — CREATININE, SERUM
Creatinine: 1.22 mg/dL (ref 0.60–1.30)
EGFR (African American): >60
EGFR (Non-African Amer.): >60

## 2014-02-09 LAB — VANCOMYCIN, TROUGH: Vancomycin, Trough: 10 ug/mL (ref 10–20)

## 2014-02-10 LAB — CREATININE, SERUM
CREATININE: 1.1 mg/dL (ref 0.60–1.30)
EGFR (Non-African Amer.): >60

## 2014-02-10 LAB — VANCOMYCIN, TROUGH: VANCOMYCIN, TROUGH: 15 ug/mL (ref 10–20)

## 2014-02-11 LAB — CULTURE, BLOOD (SINGLE)

## 2014-02-13 LAB — CREATININE, SERUM: Creatinine: 1.22 mg/dL (ref 0.60–1.30)

## 2014-02-14 LAB — VANCOMYCIN, TROUGH: VANCOMYCIN, TROUGH: 18 ug/mL (ref 10–20)

## 2014-02-16 LAB — CREATININE, SERUM
CREATININE: 1.1 mg/dL (ref 0.60–1.30)
EGFR (African American): >60
EGFR (Non-African Amer.): >60

## 2014-02-19 LAB — CBC WITH DIFFERENTIAL/PLATELET
Basophil #: 0 10*3/uL (ref 0.0–0.1)
Basophil %: 0.8 %
EOS ABS: 0 10*3/uL (ref 0.0–0.7)
Eosinophil %: 0.8 %
HCT: 35 % — ABNORMAL LOW (ref 40.0–52.0)
HGB: 12 g/dL — ABNORMAL LOW (ref 13.0–18.0)
Lymphocyte #: 0.8 10*3/uL — ABNORMAL LOW (ref 1.0–3.6)
Lymphocyte %: 14.6 %
MCH: 33.1 pg (ref 26.0–34.0)
MCHC: 34.2 g/dL (ref 32.0–36.0)
MCV: 97 fL (ref 80–100)
Monocyte #: 0.6 x10 3/mm (ref 0.2–1.0)
Monocyte %: 11.6 %
NEUTROS ABS: 3.8 10*3/uL (ref 1.4–6.5)
Neutrophil %: 72.2 %
Platelet: 124 10*3/uL — ABNORMAL LOW (ref 150–440)
RBC: 3.62 10*6/uL — ABNORMAL LOW (ref 4.40–5.90)
RDW: 15.5 % — AB (ref 11.5–14.5)
WBC: 5.2 10*3/uL (ref 3.8–10.6)

## 2014-02-19 LAB — COMPREHENSIVE METABOLIC PANEL
ALBUMIN: 2.3 g/dL — AB (ref 3.4–5.0)
Alkaline Phosphatase: 99 U/L
Anion Gap: 5 — ABNORMAL LOW (ref 7–16)
BUN: 24 mg/dL — ABNORMAL HIGH (ref 7–18)
Bilirubin,Total: 1.3 mg/dL — ABNORMAL HIGH (ref 0.2–1.0)
CO2: 29 mmol/L (ref 21–32)
CREATININE: 1.05 mg/dL (ref 0.60–1.30)
Calcium, Total: 8.2 mg/dL — ABNORMAL LOW (ref 8.5–10.1)
Chloride: 105 mmol/L (ref 98–107)
EGFR (African American): >60
GLUCOSE: 81 mg/dL (ref 65–99)
OSMOLALITY: 281 (ref 275–301)
Potassium: 3.9 mmol/L (ref 3.5–5.1)
SGOT(AST): 18 U/L (ref 15–37)
SGPT (ALT): 43 U/L
SODIUM: 139 mmol/L (ref 136–145)
TOTAL PROTEIN: 5.1 g/dL — AB (ref 6.4–8.2)

## 2014-02-22 ENCOUNTER — Ambulatory Visit: Payer: Self-pay | Admitting: Radiation Oncology

## 2014-03-08 ENCOUNTER — Ambulatory Visit: Payer: Medicare Other | Admitting: Internal Medicine

## 2014-03-10 ENCOUNTER — Ambulatory Visit: Payer: Self-pay | Admitting: Radiation Oncology

## 2014-03-10 DEATH — deceased

## 2014-06-30 NOTE — Op Note (Signed)
PATIENT NAME:  Hunter Booth, Hunter Booth MR#:  [id number] DATE OF BIRTH:  05/17/1931  DATE OF PROCEDURE:  01/05/2013  PROCEDURE PERFORMED: 1. Pars plana vitrectomy of the left eye.  2. Internal limiting membrane peel of the left eye.  3. Gas exchange of the left eye.   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   POSTOPERATIVE DIAGNOSIS:  Full-thickness macular hole of the left eye.   ESTIMATED BLOOD LOSS: Less than 1 mL.   PRIMARY SURGEON: Garlan Fair, M.D.   ANESTHESIA: Retrobulbar block of the left eye with monitored anesthesia care.   COMPLICATIONS: None.   ESTIMATED BLOOD LOSS: Less than 1 mL.   INDICATIONS FOR PROCEDURE: This patient presented to my office with sudden vision loss of the left eye. Examination revealed a full thickness macular hole of the left eye. Risks, benefits, and alternatives of the above procedure were discussed and the patient wished to proceed.   DETAILS OF PROCEDURE: After informed consent was obtained, the patient was brought into the operative suite at [facility]. The patient was placed in supine position, was given a small dose of Alfentanyl and a retrobulbar block was performed on the left eye by the primary surgeon without any complications. The left eye was prepped and draped in sterile manner. After lid speculum was inserted, a 25-gauge trocar was placed inferotemporally through displaced conjunctiva in an oblique fashion 4 mm beyond the limbus. The infusion cannula was turned on and inserted through the trocar and secured in position with Steri-Strips. Two more trocars were placed in a similar fashion superotemporally and superonasally. Vitreous cutter and light pipe were introduced in the eye and a core vitrectomy was performed. The vitreous face was engaged using suction and elevated off of the retina without complication. The vitreous face was trimmed away, and the peripheral vitreous was trimmed for 360  degrees with care taken not to hit the crystalline lens. Indocyanine green was injected onto the posterior pole and removed within 30 seconds. Internal limiting membrane peel was created for 360 degrees around the fovea for a total diameter of two disk diameters. A scleral depressed exam was performed for 360 degrees and no sign of any breaks, tears, or retinal detachment could be identified. A complete air-fluid exchange was performed. Four minutes was allowed to elapse for dehydration of the vitreous base. This remnant fluid was removed. 22% SF6 was used as an air gas exchange and the trocars were removed. The wounds were noted to be airtight and pressure in the eye was confirmed to be approximately 15 mmHg, 5 mg of dexamethasone was given into the inferior fornix and the lid speculum was removed. The eye was cleaned and TobraDex was placed in the eye. The patient was taken to postanesthesia care with instruction to remain face down.     ____________________________ Teresa Pelton. Starling Manns, MD mfa:[id number] D: 01/05/2013 08:59:30 ET T: 01/05/2013 09:17:42 ET JOB#: [id number]  cc: Teresa Pelton. Starling Manns, MD, <Dictator> Coralee Rud MD ELECTRONICALLY SIGNED 02/09/2013 8:36

## 2014-07-01 NOTE — Discharge Summary (Signed)
PATIENT NAME:  Hunter Booth, Hunter Booth MR#:  [id number] DATE OF BIRTH:  [birth year]  Please refer to interim discharge summary dictated by Dr. Darvin Neighbours on 02/17/2014.   DISCHARGE DIAGNOSES: As follows: 1.  Seizure due to progressive glioblastoma multiforme.  2.  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 Vickki Muff.  3.  Peripheral vascular disease, status post stenting during this admission. 4.  Glioblastoma multiforme, status post radiation therapy and chemotherapy with worsening progression and vasogenic edema resulting in seizures.  5.  Essential hypertension.  6.  Dementia with postoperative delirium.  7.  Malnutrition. 8.  Hypokalemia.  9.  Thrombocytopenia.  10.  History of coronary artery disease. 11.  Tobacco abuse.   DISCHARGE MEDICATIONS:  1.  Lorazepam 0.5 mg 1-2 tablets sublingually every 2-4 hours as needed. 2.  Morphine 20 mg in 1 mL oral concentrate 0.5 mL every 1-2 hours as needed.  3.  Phenobarbital suppository 200 mg per rectum once daily.  4.  Zofran ODT 4 mg every 6 hours as needed.   Please refer to interim discharge summary dictated by Dr. Darvin Neighbours on 02/17/2014.  In short, the patient is an 81 year old gentleman with recently diagnosed glioblastoma multiforme who is on  radiation and chemotherapy for that. Presents to the hospital on 02/06/2014 with complaints of right foot infection. Please refer to Dr. Governor Specking admission note on 02/06/2014. The patient was admitted to the hospital, was seen by Dr. Delana Meyer, underwent Crosser atherectomy, PTA and stent of right superficial femoral artery as well as popliteal artery percutaneous transluminal angioplasty with stent placement for failed angioplasty in right distal superficial femoral artery and popliteal artery by Dr. Delana Meyer on 02/14/2014.   After vascular procedure the patient was seen by podiatrist and underwent a right great toe amputation due to right 5th ray  gangrene and right great toe gangrene, debridement to bone o right 2nd and 4th toes was performed by Dr. Vickki Muff on 02/17/2014.    After this operation, the patient developed significant confusion, agitation requiring psychiatrist intervention in consultation. He developed seizures on 02/20/2014. CT scan done on 02/20/2014, revealed progression of  glioblastoma as well as vasogenic edema and the patient was initiated on high doses of dexamethasone and neurology consultation was obtained and he was also initiated on [place] IV for seizure disorder. The patient was eventually consulted by palliative care physician, Dr. Ermalinda Memos, as well as her nurse practitioner, Mr. Mahalia Longest. The patient's family agreed for hospice home admission where patient will be discharged today on 02/21/2014.    ____________________________ Theodoro Grist, MD rv:LT D: 02/21/2014 20:34:00 ET T: 02/21/2014 21:10:27 ET JOB#: [id number]  cc: Theodoro Grist, MD, <Dictator> Einar Pheasant, MD [place] MD ELECTRONICALLY SIGNED 03/09/2014 17:02

## 2014-07-01 NOTE — H&P (Signed)
PATIENT NAME:  Hunter Booth, Hunter Booth MR#:  [id number] DATE OF BIRTH:  10-15-1931  DATE OF ADMISSION:  02/06/2014  PRIMARY DOCTOR:  Dr. Einar Pheasant.    EMERGENCY ROOM PHYSICIAN: Dr. Delman Kitten.    CHIEF COMPLAINT: Right foot infection.   HISTORY OF PRESENT ILLNESS: This patient is an 81 year old male patient with history of peripheral artery disease status post angioplasty and stent placement in the right common iliac artery and also left common iliac artery on November 24 by Dr. Delana Meyer, comes today because of right foot swelling, pain, and redness which has worsened since yesterday. The patient has been evaluated by Dr. Delana Meyer for right foot gangrene and he had intervention done on November 24 with angiogram and he had angioplasty and stent placement in left common iliac artery and had angioplasty and stent placement in the right common iliac artery. The patient went home and the patient continued to have pain in the right foot with worsening of redness and swelling since yesterday. Concerning this family brought him here. In the ER the patient's pulses were felt with doppler, but they are very feeble and Dr. Delana Meyer wanted [place] to admit the patient for cellulitis and he will see the patient for possible further intervention with possible stent in the right SFA and also possible amputation of the small toe on the right foot. The patient did not have any fever, but has a lot of pain and difficulty in ambulation because of the pain. According to family all of this started 3 weeks ago. The patient has heavy tobacco abuse history, still smokes about 10 cigarettes a day. Past medical history is significant for history of glioblastoma multiforme, he is on Decadron every day 4 mg and also the patient is getting radiation therapy and also Temodar chemotherapy. The patient also had stereotactic guided biopsy at [facility]. The patient right now is on chemotherapy with Temodar and radiation therapy, last dose of radiation  therapy was today.  The patient's family says that his chemotherapy is helping him, has been having more stability when he walks, and according to family they are hopeful that he will recover. He is taking steroids to decrease the brain edema.   PAST MEDICAL HISTORY:   1.  The patient's other medical problems include hypertension.  2.  Hyperlipidemia.  3.  Coronary artery disease with a history of stent placement.  4.  History of multifocal glioblastoma diagnosed in this year September.   SOCIAL HISTORY: Used to smoke 2 packs of cigarettes a day, now cut down to 10 cigarettes a day. Alcohol, none.   FAMILY HISTORY: Remarkable for heart disease but no malignancies.    HOME MEDICATIONS:   1.  Include Decadron 4 mg daily.  2.  He is supposed to be on HCTZ and lisinopril, but according to the daughter he is not taking them due to low blood pressures.  3.  The patient takes Phenergan 25 mg q. 6 hours as needed for nausea.   REVIEW OF SYSTEMS:  CONSTITUTIONAL: Has no fever. No fatigue.  EYES: No blurred vision.  EARS, NOSE, AND THROAT: No tinnitus. No epistaxis. No difficulty swallowing.  RESPIRATORY: No cough. No wheezing.  CARDIOVASCULAR: No chest pain, no orthopnea, no PND.  GASTROINTESTINAL: No nausea. No vomiting. No abdominal pain.   GENITOURINARY:  No dysuria.  ENDOCRINE: No polyphagia or polydipsia.  HEMATOLOGIC: No anemia. The patient has history of glioblastoma multiforme.   MUSCULOSKELETAL: No joint pains. Does have right foot gangrene, black discoloration of  the right little toe and also swelling and redness are present on the right foot up to the ankle.  NEUROLOGIC: The patient has no dysarthria. No tremors. No history of strokes.  PSYCHIATRIC: No anxiety or insomnia.   PHYSICAL EXAMINATION:  VITAL SIGNS: Temperature 98 Fahrenheit, heart rate 80, blood pressure 136/60, O2 saturations 98% on room air.  GENERAL:  This patient is alert, awake, oriented, elderly male, not in  distress.  HEAD: Atraumatic, normocephalic.  EYES: Pupils equal, reacting to light. Extraocular movements intact.  EARS, NOSE, AND THROAT: No tympanic membrane congestion. No turbinate hypertrophy. No oropharyngeal erythema.  NECK: Supple. No JVD. No carotid bruit.    RESPIRATORY: Good respiratory effort. Clear to auscultation. No wheeze. No rales.  ABDOMEN: Soft, nontender, nondistended. Bowel sounds present.  MUSCULOSKELETAL: The patient has erythema and edema of the right ankle, there is dry gangrene present in the right fifth toe and also some dry gangrene with black discoloration on the right foot great toe. The patient has very feeble dopplerable pulse in dorsalis pedis and popliteal. The patient's left foot has no erythema or edema and pulses are present in the left dorsalis and popliteal arteries. SKIN: Warm and dry.  LYMPHATICS: No lymphadenopathy in cervical or axillary region.  NEUROLOGICAL: Alert, awake, oriented. Cranial nerves II through XII are intact. Power 5 out of 5 in upper and lower extremities. Sensation is intact. DTRs 2 + bilaterally.  PSYCHIATRIC: Mood and affect are within normal and slightly uncomfortable due to pain in the right leg.   LABORATORY DATA:  White count 12.3, hemoglobin 14.6, hematocrit 43.9, platelets 142,000. Electrolytes, sodium is 137, potassium 3.5, chloride 100, bicarbonate 31, BUN 44, creatinine 1.29, glucose 128. The patient's pH 7.48, pCO2 of 39, pO2 80.   ASSESSMENT AND PLAN:  1.  This patient is an 81 year old male with ongoing right foot gangrene with right foot cellulitis. The patient is going to be admitted to medicine, Dr. Delana Meyer will see the patient tomorrow for further vascular intervention for the right foot. The patient is right now on vancomycin and Zosyn. We will add clindamycin as well.    2.  Regarding cellulitis we will request ID consult for management of cellulitis and antibiotic use.  3.  History of glioblastoma multiforme status  post radiation therapy today, he is on Temodar and radiation and also Decadron. Continue the Decadron as well.  4.  History of hypertension before, right now blood pressure is normal and not on blood pressure medication at home. Watch carefully and add blood pressure medication if needed.  5.  Pain control with IV Dilaudid.  6.  Slight dehydration. Continue IV fluids.  7.  Further plan pending ID evaluation and vascular evaluation. Continue antibiotics for cellulitis and request podiatric consult for possible toe amputation of the right fifth toe.     TIME SPENT: 55 minutes.      ____________________________ Epifanio Lesches, MD sk:bu D: 02/06/2014 15:03:45 ET T: 02/06/2014 15:44:16 ET JOB#: [id number]  cc: Epifanio Lesches, MD, <Dictator> Epifanio Lesches MD ELECTRONICALLY SIGNED 02/21/2014 23:07

## 2014-07-01 NOTE — Consult Note (Signed)
Brief Consult Note: Diagnosis: ASO with gangrene of the right foot.   Patient was seen by consultant.   Recommend to proceed with surgery or procedure.   Comments: patient is s/p bilateral iliac stenting and has a warm foot with a 2+ femoral pulse.  He has a known SFA occlusion which could not be treated at the same time.  I will see how he does my plan is to reangio and intervene on the right SFA but I was hoping that the inflamation in the right groin would subside before I had to.  Possibly angio on Friday.  Electronic Signatures: Hortencia Pilar (MD)  (Signed 01-Dec-15 20:42)  Authored: Brief Consult Note   Last Updated: 01-Dec-15 20:42 by Hortencia Pilar (MD)

## 2014-07-01 NOTE — Consult Note (Signed)
General Aspect ASO with gangrene right foot   Present Illness This patient is an 81 year old male with history of peripheral artery disease status post angioplasty and stent placement in the right common iliac artery and also left common iliac artery on November 24,  who presented to the ER with of right foot swelling, pain, and redness which has worsened since the day before admission. He had angioplasty and stent placement in left common iliac artery and in the right common iliac artery. The patient went home after teh initial procedure but continued to have pain in the right foot.  Family noted increasing redness and swelling since yesterday. Concerning this family brought him here. In the ER the patient???s pulses were noted with doppler, and he is admitted for cellulitis and will require further intervention with possible stent in the right SFA and also possible amputation of the small toe on the right foot. The patient did not have any fever. According to family all of this started 3 weeks ago. The patient has heavy tobacco abuse history, still smokes about 10 cigarettes a day.   PAST MEDICAL HISTORY:   1.  Hypertension.  2.  Hyperlipidemia.  3.  Coronary artery disease with a history of stent placement.  4.  History of multifocal glioblastoma diagnosed in this year September.   Home Medications: Medication Instructions Status  ALPRAZolam 0.5 mg oral tablet 1 tab(s) orally 2 times a day Active  dexamethasone 4 mg oral tablet Take 1 tablet (4 mg) orally 3 times a day x 7 days, then take 1 tablet (4 mg) orally 2 times a day x 7 days,  then take one-half tablet (2 mg) orally 2 times a day x 14 days, then continue  one-half tablet (2 mg) once daily.  Active  promethazine 25 mg oral tablet 1 tab(s) orally every 6 hours as needed for nausea or vomiting Active  Toprol-XL 50 mg oral tablet, extended release 1 tab(s) orally once a day Active  timadar 140 milligram(s) orally once a day Active   hydrochlorothiazide 25 mg oral tablet 1 tab(s) orally once a day Active  felodipine 5 mg oral tablet, extended release 1 tab(s) orally once a day Active  acetaminophen 325 mg oral tablet 2 tab(s) orally every 6 hours, As Needed - for Pain Active    No Known Allergies:   Case History:  Family History Non-Contributory   Social History positive  tobacco, negative ETOH, negative Illicit drugs   Review of Systems:  Fever/Chills No   Cough No   Sputum No   Abdominal Pain No   Diarrhea No   Constipation No   Nausea/Vomiting No   SOB/DOE No   Chest Pain No   Telemetry Reviewed NSR   Dysuria No   Physical Exam:  GEN well developed, well nourished   HEENT hearing intact to voice, dry oral mucosa   NECK supple  trachea midline   RESP normal resp effort  no use of accessory muscles   CARD regular rate  LE edema present   ABD denies tenderness  nondistended   EXTR positive edema, pulses nonpalpable bilaterally bothe feet warm with brisk cap refill, right foor with erythema and 3+ edema   SKIN positive rashes, positive ulcers, gangrene multiple toes right foot   NEURO follows commands, motor/sensory function intact   PSYCH alert, poor insight   Nursing/Ancillary Notes: **Vital Signs.:   01-Dec-15 13:57  Temperature Temperature (F) 97.4  Celsius 36.3  Pulse Pulse 74  Respirations  Respirations 20  Systolic BP Systolic BP 161  Diastolic BP (mmHg) Diastolic BP (mmHg) 75  Mean BP 90  Pulse Ox % Pulse Ox % 98  Pulse Ox Activity Level  With exertion  Oxygen Delivery Room Air/ 21 %   Hepatic:  30-Nov-15 12:01   Bilirubin, Total 1.0  Alkaline Phosphatase 89 (46-116 NOTE: New Reference Range 09/27/13)  SGPT (ALT) 49 (14-63 NOTE: New Reference Range 09/27/13)  SGOT (AST) 15  Total Protein, Serum  6.3  Albumin, Serum  3.2  Routine Micro:  30-Nov-15 14:00   Micro Text Report BLOOD CULTURE   COMMENT                   NO GROWTH AEROBICALLY/ANAEROBICALLY IN  5 DAYS   ANTIBIOTIC                       Culture Comment NO GROWTH AEROBICALLY/ANAEROBICALLY IN 5 DAYS  Result(s) reported on 11 Feb 2014 at 02:00PM.    14:03   Micro Text Report BLOOD CULTURE   COMMENT                   NO GROWTH AEROBICALLY/ANAEROBICALLY IN 5 DAYS   ANTIBIOTIC                       Culture Comment NO GROWTH AEROBICALLY/ANAEROBICALLY IN 5 DAYS  Result(s) reported on 11 Feb 2014 at 02:00PM.  Routine Chem:  30-Nov-15 12:01   Creatinine (comp) 1.29  eGFR (African American) >60  eGFR (Non-African American)  57 (eGFR values <71m/min/1.73 m2 may be an indication of chronic kidney disease (CKD). Calculated eGFR, using the MRDR Study equation, is useful in  patients with stable renal function. The eGFR calculation will not be reliable in acutely ill patients when serum creatinine is changing rapidly. It is not useful in patients on dialysis. The eGFR calculation may not be applicable to patients at the low and high extremes of body sizes, pregnant women, and vegetarians.)  Glucose, Serum  128  BUN  44  Sodium, Serum 137  Potassium, Serum 3.5  Chloride, Serum 100  CO2, Serum 31  Calcium (Total), Serum 8.9  Anion Gap  6  Osmolality (calc) 287  01-Dec-15 04:10   Creatinine (comp)  1.32  eGFR (African American) >60  eGFR (Non-African American)  55 (eGFR values <653mmin/1.73 m2 may be an indication of chronic kidney disease (CKD). Calculated eGFR, using the MRDR Study equation, is useful in  patients with stable renal function. The eGFR calculation will not be reliable in acutely ill patients when serum creatinine is changing rapidly. It is not useful in patients on dialysis. The eGFR calculation may not be applicable to patients at the low and high extremes of body sizes, pregnant women, and vegetarians.)  Glucose, Serum  103  BUN  38  Sodium, Serum 138  Potassium, Serum  3.3  Chloride, Serum 101  CO2, Serum 29  Calcium (Total), Serum  8.1  Anion Gap 8   Osmolality (calc) 285  Routine Hem:  30-Nov-15 12:01   WBC (CBC)  12.3  RBC (CBC) 4.55  Hemoglobin (CBC) 14.6  Hematocrit (CBC) 43.9  Platelet Count (CBC)  142  MCV 97  MCH 32.1  MCHC 33.2  RDW 14.5  Neutrophil % 82.4  Lymphocyte % 9.3  Monocyte % 8.1  Eosinophil % 0.1  Basophil % 0.1  Neutrophil #  10.1  Lymphocyte # 1.1  Monocyte #  1.0  Eosinophil # 0.0  Basophil # 0.0 (Result(s) reported on 06 Feb 2014 at 12:29PM.)  01-Dec-15 04:10   WBC (CBC) 9.7  RBC (CBC)  4.00  Hemoglobin (CBC) 13.0  Hematocrit (CBC)  38.3  Platelet Count (CBC)  109  MCV 96  MCH 32.6  MCHC 34.0  RDW  15.1  Neutrophil % 75.2  Lymphocyte % 13.9  Monocyte % 10.7  Eosinophil % 0.1  Basophil % 0.1  Neutrophil #  7.3  Lymphocyte # 1.4  Monocyte # 1.0  Eosinophil # 0.0  Basophil # 0.0 (Result(s) reported on 07 Feb 2014 at [facility].)    Impression 1.  Right foot cellulitis. The patient is on vancomycin and Zosyn. Clindamycin as well.  request ID consult  2.  ASO bialteral lower extremities iliac interventions are successful and he now has 2+ femoral pulses.  On the right he has evidence fo SFA disease and this will need to be treated.  At the very least he will require amputatin of the 5th toe on the right foot.  I will plan for further intervention as the schedule allows.  3.  History of glioblastoma multiforme status post radiation therapy today, he is on Temodar and radiation and also Decadron. Continue the Decadron as well.  4.  History of hypertension before, right now blood pressure is normal and not on blood pressure medication at home. Watch carefully and add blood pressure medication if needed.  5.  Pain control with IV Dilaudid.   Plan level 3 consult   Electronic Signatures: Hortencia Pilar (MD)  (Signed 05-Dec-15 15:58)  Authored: General Aspect/Present Illness, Home Medications, Allergies, History and Physical Exam, Vital Signs, Labs, Impression/Plan   Last Updated: 05-Dec-15  15:58 by Hortencia Pilar (MD)

## 2014-07-01 NOTE — Op Note (Signed)
PATIENT NAME:  Hunter Booth, Hunter Booth MR#:  [id number] DATE OF BIRTH:  [birth year]  DATE OF PROCEDURE:  02/14/2014  PREOPERATIVE DIAGNOSIS:  Atherosclerotic occlusive disease, bilateral lower extremities, with gangrene of the right foot.   POSTOPERATIVE DIAGNOSIS:  Atherosclerotic occlusive disease, bilateral lower extremities, with gangrene of the right foot.     PROCEDURES PERFORMED:  1.  Right lower extremity angiography, second-order catheter placement.  2.  Crosser atherectomy, right superficial femoral artery and popliteal.  3.  Percutaneous transluminal angioplasty with stent placement for failed angioplasty, right distal superficial femoral artery and popliteal.   SURGEON: Katha Cabal, MD    SEDATION:  Precedex drip. Continuous ECG, pulse oximetry and cardiopulmonary monitoring is performed throughout the entire procedure by the interventional radiology nurse. Total sedation time was 2 hours.   ACCESS: A 6 French sheath, antegrade, right common femoral artery.   FLUOROSCOPY TIME:  6.2 minutes.   CONTRAST USED: Isovue 50 mL.   INDICATIONS:  Mr. Conover is an 81 year old gentleman who presented earlier this month, or late last month, secondary to gangrenous changes of his right foot.  At that time, he was found to have iliac artery occlusions and underwent bilateral percutaneous transluminal angioplasty with stent placement in the common iliac arteries using the kissing balloon technique.  This of course, negates the ability to cross over the aortic bifurcation and therefore he is now being returned for further treatment of his right lower extremity via an antegrade access.  Risks and benefits were reviewed with the family.  All questions answered. The patient and family have agreed to proceed.   DESCRIPTION OF PROCEDURE: The patient is taken to special procedures and placed in the supine position. After adequate sedation is achieved, the right groin is prepped and draped in sterile  fashion. Ultrasound is placed in a sterile sleeve. Ultrasound is utilized secondary to lack of appropriate landmarks and to avoid vascular injury. Under direct visualization, the common femoral artery is identified. It is echolucent and pulsatile indicating patency. Image is recorded for the permanent record.  Under real-time visualization, a micropuncture needle is used to access the common femoral artery, microwire followed a stiffened micro sheath, J-wire followed by a 6 French sheath.  Heparin 4000 units is given.  Hand injection of contrast is then used to demonstrate the right lower extremity distal runoff, which was inadequately visualized at the initial angiography. Tibial vessels were not visualized and therefore further evaluation was required today.   SFA occlusion is identified at Hunter's canal with reconstitution of the popliteal at the level of the tibial plateau. A straight sidekick catheter is advanced over a J-wire down to the occlusion and a 14 S crosser catheter is advanced.  Crosser catheter and sidekick are then used to negotiate the occlusion down to below the tibial plateau.  The catheter is removed and a hand injection of contrast through the sidekick demonstrates intraluminal positioning. A Versacore 0.035 wire is then advanced through and two 5 x 100 Lutonix balloons are used; each balloon is inflated to 12 atmospheres for 3 minutes.  Follow-up imaging after balloon inflation demonstrates high-grade residual stenosis greater than 50%, as well as a flow-limiting dissection at Hunter's canal and therefore 2 life stents, a 6 x 120 and a 6 x 40 were then deployed and post-dilated with the 5 balloons.  Follow-up imaging demonstrates wide patency of the SFA and popliteal with preservation of the distal runoff.   The sheath is then pulled back into the common femoral.  Oblique view is obtained, and a Mynx device deployed without difficulty.  There are no immediate complications.    INTERPRETATION: Initial images demonstrate the occlusion in the SFA, popliteal, as noted, there is reconstitution of the popliteal at the level of the tibial plateau.  There is 3-vessel runoff to the foot with distal imaging demonstrating the anterior tibial, posterior tibial are actually patent and filling the pedal arch.  Peroneal is somewhat small and occludes distally.   Following crosser atherectomy verification of intraluminal positioning is made and subsequently angioplasty is performed with Lutonix balloons.  The result is not adequate and a 6 mm wide life stents are placed and post dilated to 5.  Results following stent placement are excellent.   SUMMARY: Successful salvage of right lower extremity with recanalization of the SFA popliteal occlusion.       ____________________________ Katha Cabal, MD ggs:DT D: 02/14/2014 10:51:18 ET T: 02/14/2014 11:37:10 ET JOB#: [id number]  cc: Katha Cabal, MD, <Dictator> Armstead Peaks, MD Rhett Bannister. Ma Hillock, MD Dr. Elizabeth Sauer, MD    Katha Cabal MD ELECTRONICALLY SIGNED 02/17/2014 11:28

## 2014-07-01 NOTE — Consult Note (Signed)
Reason for Visit: This 81 year old Male patient presents to the clinic for initial evaluation of  GBM .   Referred by [facility].  Diagnosis:  Chief Complaint/Diagnosis   81 year old male with multifocal GBM temporal occipital for consideration of radiation therapy plus Temodar.  Pathology Report pathology report reviewed   Imaging Report MRI scan was reviewed   Referral Report clinical notes reviewed   Planned Treatment Regimen partial brain radiation plus Temodar   HPI   patient is an 81 year old male who presents with agraphia and worsening confusion. He is right-handed and has a past medical history significant for coronary artery disease hypertension. He was noted on MRI scan to have a leftparietal, temporal occipital lesions with extension across the splenium of the corpus callosum. Underwent stereotactic guided biopsy which was positive for GBM. His workup and biopsy were all performed at [facility]. He is also having some visual fields disturbances with some loss of vision peripherally in the right eye although this is stabilized. Recommendations at [facility] were nonsurgical intervention based on the multifocal nature of his disease as well as involvement of the splenium. Recommendation has been made for Temodar as well as radiation therapy. He is seen today. He states he is in no pain or acute distress. He is having no headaches. Vision has stabilized he is more steady on his feet.  Past Hx:    MI:    kidney:    Hyperlipidemia:    Hypertension:   Past, Family and Social History:  Past Medical History positive   Cardiovascular cardiac catheterization; coronary artery disease; coronary artery stents; hyperlipidemia; hypertension; myocardial infarction; peripheral vascular disease   Family History positive   Family History Comments mother with coronary heart disease and MI father with MI no family history of cancer   Social History positive    Social History Comments greater than 50-pack-year smoking history continues to smoke a pack a day no EtOH abuse history   Additional Past Medical and Surgical History accompanied by multiple family members today   Allergies:   No Known Allergies:   Home Meds:  Home Medications: Medication Instructions Status  acetaminophen 325 mg oral tablet 2 tab(s) orally every 6 hours, As Needed - for Pain Active  felodipine 5 mg oral tablet, extended release 1 tab(s) orally once a day Active  hydrochlorothiazide 25 mg oral tablet 1 tab(s) orally once a day Active  lisinopril 40 mg oral tablet 1 tab(s) orally once a day Active  Toprol-XL 50 mg oral tablet, extended release 1 tab(s) orally once a day Active  potassium chloride 10 mEq oral capsule, extended release 1 cap(s) orally once a day Active  rosuvastatin 20 mg oral tablet 1 tab(s) orally once a day (at bedtime) Active  aspirin 81 mg oral tablet 1 tab(s) orally once a day Active   Review of Systems:  General negative   Performance Status (ECOG) 0   Skin negative   Breast negative   Ophthalmologic see HPI   ENMT negative   Respiratory and Thorax negative   Cardiovascular see HPI   Gastrointestinal negative   Genitourinary negative   Musculoskeletal negative   Neurological see HPI   Psychiatric negative   Hematology/Lymphatics negative   Endocrine negative   Allergic/Immunologic negative   Nursing Notes:  Nursing Vital Signs and Chemo Nursing Nursing Notes: *CC Vital Signs Flowsheet:   14-Oct-15 09:18  Temp Temperature 97.3  Pulse Pulse 79  Respirations Respirations 20  SBP SBP 134  DBP DBP 80  Pain Scale (0-10)  0  Current Weight (kg) (kg) 69   Physical Exam:  General/Skin/HEENT:  General normal   Skin normal   ENMT normal   Head and Neck normal   Additional PE well-developed well-nourished male in NAD. Lungs are clear to A&P cardiac examination shows regular rate and rhythm. Visual fields show  some decreased vision in the right peripheral field. Cranial nerves II through XII are grossly intact. Motor sensory and DTR levels are equal and symmetric in the upper lower extremities. Proprioception is intact. On eye examination he does have early cataracts fundi appear benign although although difficult to visualize.   Breasts/Resp/CV/GI/GU:  Respiratory and Thorax normal   Cardiovascular normal   Gastrointestinal normal   Genitourinary normal   MS/Neuro/Psych/Lymph:  Musculoskeletal normal   Neurological normal   Lymphatics normal   Other Results:  Radiology Results: MRI:    21-Sep-15 14:59, MRI Brain  With/Without Contrast  MRI Brain  With/Without Contrast   REASON FOR EXAM:    Confusion    Eval for TIA  COMMENTS:       PROCEDURE: MR  - MR BRAIN WO/W CONTRAST  - Nov 28 2013  2:59PM     CLINICAL DATA:  81 year old male presenting with confusion and  memory loss.    EXAM:  MRI HEAD WITHOUT AND WITH CONTRAST    TECHNIQUE:  Multiplanar, multiecho pulse sequences of the brain and surrounding  structures were obtained without and with intravenous contrast.  CONTRAST:  14 cc MultiHance.    COMPARISON:  08/12/2006 MR.    FINDINGS:  Large irregular enhancing lesion with epicenter in the left  periatrial region has extension to posterior aspect of the left  lenticular nucleus, posterior left operculum and crosses the  splenium of the corpus callosum with approximate maximal dimensions  of 5.6 x 4.6 x 6 cm.    The appearance is worrisome for glioblastoma with lymphoma a  secondary less likely consideration. Metastatic disease felt to be  much less likely consideration.  Subependymal spread tumor posterior horn left lateral ventricle and  left periatrialregion.    Local mass effect upon the atrium of the left lateral ventricle.    T2/FLAIR altered signal intensity surrounds the necrotic enhancing  component of tumor. In addition to vasogenic edema, this  T2/FLAIR  altered signal intensity typically contains tumor cells. This  extends to involve the right periatrial region.    Within the peripheral aspect of the left parietal lobe, 7 mm  nonenhancing lesion demonstrates restricted motion. This is felt  most likely represent tumor rather than infarct.    Major intracranial vascular structures are patent.  Tiny area of blood breakdown products right operculum region and  right cerebellar tonsils most consistent with result of prior  hemorrhagic ischemia.    Opacification left mastoid air cells. Lobulated 1.7 cm lesion  posterior superior left nasopharynx. Question malignancy versus non  malignant process such as complex asymmetric Thornwaldt cyst.    Mild spinal stenosis C3-4.    Small vessel disease type changes.     IMPRESSION:  Irregular necrotic-appearing mass centered at the left periatrial  region with extension of tumor as detailed above including crossing  the splenium of the corpus callosum suspicious for glioblastoma.  Lymphoma is a secondary consideration with metastatic disease felt  to be much less likely consideration. Local mass effect most  prominent left periatrial region with subependymal spread of tumor  left periatrial region suspected.  Opacification left mastoid air cells. Lobulated 1.7 cm lesion  posterior superior left nasopharynx. Question malignancy versus non  malignant process such as complex asymmetric Thornwaldt cyst.    These results were called by telephone at the time of interpretation  on 11/28/2013 at 3:09 pm to Dr. Einar Pheasant , who verbally  acknowledged these results.    Electronically Signed    By: Chauncey Cruel M.D.    On: 11/28/2013 16:30         Verified By: Doug Sou, M.D.,   Relevent Results:   Relevant Scans and Labs MRI scans are reviewed   Assessment and Plan: Impression:   multifocal GBM of the left hemisphere in 81 year old male for Temodar andr adiation  therapyin 81 year old male Plan:   this time I have set up a consultation with medical oncology for overall general care and treatment with Temodar. I will plan on delivering 6000 cGy over 6 weeks to his GBM using IM RT treatment planning and delivery despair normal brain as well as cochlea, optic nerve and brainstem. I would use MRI fusion study for treatment planning purposes. I set the patient up and ordered CT simulation with fusion study for early next week. I will also arrange consultation with medical oncology. Risks and benefits of treatment including cognitive decline, hair loss, skin reaction, alteration of blood counts, and fatigue were discussed in detail with the patient and his family. They both seem to comprehend my treatment plan well.  I would like to take this opportunity for allowing me to participate in the care of your patient..  Fax to Physician:  Physicians To Recieve Fax: Einar Pheasant, MD - [phone].  Electronic Signatures: Noell Shular, Roda Shutters (MD)  (Signed 14-Oct-15 12:03)  Authored: HPI, Diagnosis, Past Hx, PFSH, Allergies, Home Meds, ROS, Nursing Notes, Physical Exam, Other Results, Relevent Results, Encounter Assessment and Plan, Fax to Physician   Last Updated: 14-Oct-15 12:03 by Armstead Peaks (MD)

## 2014-07-01 NOTE — Op Note (Signed)
PATIENT NAME:  Hunter Booth, BOSE MR#:  [id number] DATE OF BIRTH:  Nov 06, 1931  DATE OF PROCEDURE:  05/24/2013  PREOPERATIVE DIAGNOSIS: Visually significant cataract of the left eye.   POSTOPERATIVE DIAGNOSIS: Visually significant cataract of the left eye.   OPERATIVE PROCEDURE: Cataract extraction by phacoemulsification with implant of intraocular lens to the left eye.   SURGEON: Birder Robson, MD.   ANESTHESIA:  1. Managed anesthesia care.  2. Topical tetracaine drops followed by 2% Xylocaine jelly applied in the preoperative holding area.   COMPLICATIONS: None.   TECHNIQUE:  Stop and chop.   DESCRIPTION OF PROCEDURE: The patient was examined and consented in the preoperative holding area where the aforementioned topical anesthesia was applied to the left eye and then brought back to the Operating Room where the left eye was prepped and draped in the usual sterile ophthalmic fashion and a lid speculum was placed. A paracentesis was created with the side port blade and the anterior chamber was filled with viscoelastic. A near clear corneal incision was performed with the steel keratome. A continuous curvilinear capsulorrhexis was performed with a cystotome followed by the capsulorrhexis forceps. Hydrodissection and hydrodelineation were carried out with BSS on a blunt cannula. The lens was removed in a stop and chop technique and the remaining cortical material was removed with the irrigation-aspiration handpiece. The capsular bag was inflated with viscoelastic and the Tecnis ZCB00 21.0-diopter lens, serial number [id number] was placed in the capsular bag without complication. The remaining viscoelastic was removed from the eye with the irrigation-aspiration handpiece. The wounds were hydrated. The anterior chamber was flushed with Miostat and the eye was inflated to physiologic pressure. 0.1 mL of cefuroxime concentration 10 mg/mL was placed in the anterior chamber. The wounds were found to be  water tight. The eye was dressed with Vigamox. The patient was given protective glasses to wear throughout the day and a shield with which to sleep tonight. The patient was also given drops with which to begin a drop regimen today and will follow-up with me in one day.   ____________________________ Livingston Diones. Arvin Abello, MD wlp:gb D: 05/24/2013 22:44:07 ET T: 05/25/2013 04:40:38 ET JOB#: [id number]  cc: Clariza Sickman L. Darlin Stenseth, MD, <Dictator> Livingston Diones Rene Sizelove MD ELECTRONICALLY SIGNED 05/26/2013 9:11

## 2014-07-01 NOTE — Consult Note (Signed)
ONCOLOGY followup note - still weak. Denies new headaches. No fevers.denies bleeding symptoms. Being planned for surgery.sitting in bed, A, O x 3, NAD.           vitals - afebrile, stable           lungs - b/l good air entry, no rhonchi           abd - soft, NT Labs: 12/1 - Hb 13, WBC 9700, ANC 7300, platelets 45K.    81 year old gentleman with glioblastoma multiforme, has been on radiation treatment and on concurrent chemotherapy with oral Temodar. The patient also has other multiple medical issues including ongoing issues with peripheral vascular disease causing gangrene of the right side toe and he has been admitted for further treatment of this. Agree with ongoing supportive treatment, vascular surgery and podiatry are following and likely planning amputation. Once he completes planned surgical procedures, then can plan resuming on chhemoradiation for GBM. Continue to monitor CBC/differential intermittently. Hold Temodar until he starts back on radiation. Will continue to follow as indicated for any oncology issues. Patient explained above details.   Electronic Signatures: Jonn Shingles (MD)  (Signed on 12-Dec-15 00:07)  Authored  Last Updated: 12-Dec-15 00:07 by Jonn Shingles (MD)

## 2014-07-01 NOTE — Op Note (Signed)
PATIENT NAME:  Hunter Booth, Hunter Booth MR#:  [id number] DATE OF BIRTH:  11/08/1931  DATE OF PROCEDURE:  02/17/2014  PREOPERATIVE DIAGNOSES:  1.  Right fifth ray gangrene.  2.  Right great toe gangrene.  3.  Partial gangrene of the second and fourth toes.   POSTOPERATIVE DIAGNOSES:  1.  Right fifth ray gangrene.  2.  Right great toe gangrene.  3.  Partial gangrene of the second and fourth toes.   PROCEDURE:  1.  Right fifth ray amputation.  2.  Right great toe amputation.  3.  Debridement to bone, right second and fourth toes.   SURGEON: Frank Pilger A. Vickki Muff, DPM   ANESTHESIA: General.   HEMOSTASIS: None.   COMPLICATIONS: None.   SPECIMENS: Gangrene, great toe and fifth toes.   ESTIMATED BLOOD LOSS: 25 mL.   OPERATIVE INDICATIONS: This is an 81 year old gentleman who has developed gangrenous changes to his right lower extremity. He has undergone recent revascularization and presents today for surgical excision of the gangrenous areas. The risks, benefits, alternatives and complications associated with the surgery were discussed with the patient. Informed consent has been given.   OPERATIVE PROCEDURE: The patient was brought into the OR and placed on the operating table in the supine position. General intubation was administered by the anesthesia team. The right lower extremity was then prepped and draped in the usual sterile fashion. Attention was initially addressed to the lateral aspect of the right foot where the gangrenous fifth toe and distal portion of the fifth metatarsal was noted. This was surgically excised in toto. The fifth ray was amputated approximately mid shaft of the fifth metatarsal. Further debridement was carried out excisionally from the fifth ray site with a Versajet down to good healthy bleeding bone and tissue.   Attention was then directed to the great toe where 2 semielliptical incisions were made. A full-thickness incision was taken to the bone. The toe was removed at  the MTPJ. The base of the proximal phalanx was left intact. All bleeders were Bovie cauterized, and further debridement of the deeper layers was carried out with a Versajet. Finally, excisional debridement down to bone was carried out to the lateral aspect of the fourth toe at the base as well as the medial aspect of the second toe at the gangrenous areas. This measured a total of approximately 4 x 2 cm. All bleeders were Bovie cauterized.   The great toe amputation site was reapproximated with 4-0 nylon. The fifth ray amputation site was left to granulate in, and a wound VAC was placed overlying this area. A sterile bulky compressive dressing was placed around the foot. The patient tolerated the procedure and anesthesia well. An ankle block was placed around the forefoot as well as around the surgical sites. He will be transported back to the PACU and then to his floor. At this point, we will allow the wound VAC a chance to stimulate granulation tissue to his right foot and assist with wound closure. We can re-evaluate wound closure in the future.    ____________________________ Pete Glatter Vickki Muff, DPM jaf:JT D: 02/17/2014 15:12:11 ET T: 02/17/2014 22:22:55 ET JOB#: [id number]  cc: Larkin Ina A. Vickki Muff, DPM, <Dictator> Marybell Robards DPM ELECTRONICALLY SIGNED 02/23/2014 13:08

## 2014-07-01 NOTE — Consult Note (Signed)
PATIENT NAME:  Hunter Booth, Hunter Booth MR#:  [id number] DATE OF BIRTH:  22-Dec-1931  DATE OF CONSULTATION:  02/07/2014  REFERRING PHYSICIAN:  Carol Ada, MD  CONSULTING PHYSICIAN:  Cheral Marker. Ola Spurr, MD  REASON FOR CONSULTATION: Gangrene and cellulitis.   HISTORY OF PRESENT ILLNESS: This is a pleasant 81 year old gentleman with a history of relatively recently diagnosed glioblastoma multiform as well as severe peripheral vascular disease and ongoing tobacco abuse who recently had stent placement in his right common iliac artery November 24th. He was admitted November 30th for increasing worsening of his foot pain. His pain had progressed which is why his family brought him in. He has not been having any fevers or chills. He had a mildly elevated white count. He is going to be seen by podiatry.   PAST MEDICAL HISTORY:  1.  Glioblastoma multiform, on Decadron as well as radiation, following with Dr. Ma Hillock. 2.  Peripheral vascular disease.  3.  Hypertension.  4.  Hyperlipidemia.  5.  Coronary artery disease.  6.  Tobacco abuse.   FAMILY HISTORY: Noncontributory.   SOCIAL HISTORY: Currently smoking 10 cigarettes a day, down from 2 packs a day. No alcohol.   REVIEW OF SYSTEMS: Eleven systems reviewed and negative except as per HPI.   ALLERGIES: No known drug allergies.   ANTIBIOTICS: Currently, he is on vancomycin and Zosyn as well as clindamycin.   PHYSICAL EXAMINATION:  VITAL SIGNS: Temperature 97.4, pulse 62, blood pressure 122/71, respirations 20, saturation 96% on room air.  GENERAL: He is thin, pleasant, interactive, in no acute distress.  HEENT: Pupils equal, round and reactive to light and accommodation. Extraocular movements are intact. Sclerae anicteric. Oropharynx clear.  NECK: Supple.  HEART: Regular.  LUNGS: Clear.  ABDOMEN: Soft, nontender.  EXTREMITIES: His right foot has necrotic gangrene with mummification on the right fifth toe as well as the first toe. There is a  very foul odor. There is surrounding dorsal erythema and edema. It is quite tender.   DATA: White blood count 12.3, now down to 9.7, hemoglobin 13.0, platelets 109,000. Renal functions, creatinine at 1.3. Blood cultures 2 of 2 negative.   IMPRESSION: An 81 year old gentleman with incurable glioblastoma multiform, severe peripheral vascular disease, long-standing and ongoing tobacco abuse, admitted 6 days after revascularization of his right leg for gangrene. He has extensive gangrene as well as pain and swelling in the foot. He has a mildly elevated white count, but no fevers.   RECOMMENDATIONS:  1.  I would consider palliative care consult.  2.  Continue vancomycin and piperacillin. Discontinue clindamycin.  3.  Further recommendations based on decision regarding whether to undergo surgery or not.  Thank you for the consult. I will be glad to follow with you.   ____________________________ Cheral Marker. Ola Spurr, MD dpf:TT D: 02/07/2014 14:05:40 ET T: 02/07/2014 15:10:35 ET JOB#: [id number]  cc: Cheral Marker. Ola Spurr, MD, <Dictator>

## 2014-07-01 NOTE — Consult Note (Signed)
Referring Physician:  Epifanio Lesches :   Primary Care Physician:  Epifanio Lesches : [facility], [facility], [address] [place], [place] [address]  Reason for Consult: Admit Date: 16-Feb-2014  Chief Complaint: foot infection  Reason for Consult: seizure   History of Present Illness: History of Present Illness:   81 yo RHD M presents to [facility] secondary to foot infection.  Pt had a seizure while here in the hospital for the first time and has been confused.  Pt has hx of GBM diagnosed a few months ago and was on low dose dexamethasone 84m daily and Temodar with radiation.  Pt's has unknown baseline to me and it is unclear if family knows the progression of this GBS.  Pt has been very somnolent this afternoon and has not really followed in which he usually does more.  ROS:  Review of Systems   unobtainable secondary to mental status  Past Medical/Surgical Hx:  MI:   kidney:   Hyperlipidemia:   Hypertension:   Past Medical/ Surgical Hx:  Past Medical History GBM, HTN, HLD, gangrene, MI   Past Surgical History none   Home Medications: Medication Instructions Last Modified Date/Time  ALPRAZolam 0.5 mg oral tablet 1 tab(s) orally 2 times a day 30-Nov-15 14:47  dexamethasone 4 mg oral tablet Take 1 tablet (4 mg) orally 3 times a day x 7 days, then take 1 tablet (4 mg) orally 2 times a day x 7 days,  then take one-half tablet (2 mg) orally 2 times a day x 14 days, then continue  one-half tablet (2 mg) once daily.  30-Nov-15 14:47  promethazine 25 mg oral tablet 1 tab(s) orally every 6 hours as needed for nausea or vomiting 30-Nov-15 14:47  acetaminophen 325 mg oral tablet 2 tab(s) orally every 6 hours, As Needed - for Pain 30-Nov-15 14:47  felodipine 5 mg oral tablet, extended release 1 tab(s) orally once a day 30-Nov-15 14:47  hydrochlorothiazide 25 mg oral tablet 1 tab(s) orally once a day 30-Nov-15 14:47  Toprol-XL 50 mg oral tablet,  extended release 1 tab(s) orally once a day 30-Nov-15 14:47  timadar 140 milligram(s) orally once a day 30-Nov-15 14:47   Allergies:  No Known Allergies:   Allergies:  Allergies NKDA   Social/Family History: Employment Status: retired  Lives With: alone  Living Arrangements: extended care facility  Social History: no tob, no EtOH, no illicits  Family History: no seizures, no stroke   Vital Signs: **Vital Signs.:   14-Dec-15 05:21  Vital Signs Type Routine  Temperature Temperature (F) 97.4  Celsius 36.3  Pulse Pulse 99  Respirations Respirations 18  Systolic BP Systolic BP 1154 Diastolic BP (mmHg) Diastolic BP (mmHg) 94  Mean BP 116  Pulse Ox % Pulse Ox % 99  Pulse Ox Activity Level  At rest  Oxygen Delivery Room Air/ 21 %    08:06  Telemetry pattern Cardiac Rhythm Normal sinus rhythm; pattern reported by Telemetry Clerk; Hr 95   Physical Exam: General: nl weight, mild distress  HEENT: normocephalic, sclera nonicteric, oropharynx clear  Neck: supple, no JVD, no bruits  Chest: CTA B, no wheezing  Cardiac: RRR, no murmurs, no edema, 2+ pulses  Extremities: no C/C/E, FROM   Neurologic Exam: Mental Status: lethargic, rarely follows, does scream curse words when pinched, eyes open to stimulation  Cranial Nerves: PERRLA, EOMI to Doll[place],  face symmetric, tongue midline  Motor Exam: localizes slightly more on L than R,  nl tone  Deep Tendon Reflexes: 1+/4 B, mute plantars  Sensory Exam: grimaces to pain in all 4 ext  Coordination: untestable   Lab Results: Hepatic:  13-Dec-15 07:44   Bilirubin, Total  1.3  Alkaline Phosphatase 99 (46-116 NOTE: New Reference Range 09/27/13)  SGPT (ALT) 43 (14-63 NOTE: New Reference Range 09/27/13)  SGOT (AST) 18  Total Protein, Serum  5.1  Albumin, Serum  2.3  TDMs:  08-Dec-15 05:20   Vancomycin, Trough LAB 18 (Result(s) reported on 14 Feb 2014 at 05:58AM.)  Routine Micro:  30-Nov-15 14:03   Micro Text Report BLOOD CULTURE    COMMENT                   NO GROWTH AEROBICALLY/ANAEROBICALLY IN 5 DAYS   ANTIBIOTIC                       Culture Comment NO GROWTH AEROBICALLY/ANAEROBICALLY IN 5 DAYS  Result(s) reported on 11 Feb 2014 at 02:00PM.  Routine Chem:  13-Dec-15 07:44   Glucose, Serum 81  BUN  24  Creatinine (comp) 1.05  Sodium, Serum 139  Potassium, Serum 3.9  Chloride, Serum 105  CO2, Serum 29  Calcium (Total), Serum  8.2  Osmolality (calc) 281  eGFR (African American) >60  eGFR (Non-African American) >60 (eGFR values <50m/min/1.73 m2 may be an indication of chronic kidney disease (CKD). Calculated eGFR, using the MRDR Study equation, is useful in  patients with stable renal function. The eGFR calculation will not be reliable in acutely ill patients when serum creatinine is changing rapidly. It is not useful in patients on dialysis. The eGFR calculation may not be applicable to patients at the low and high extremes of body sizes, pregnant women, and vegetarians.)  Result Comment labs - This specimen was collected through an   - indwelling catheter or arterial line.  - A minimum of 588m of blood was wasted prior    - to collecting the sample.  Interpret  - results with caution.  Result(s) reported on 19 Feb 2014 at 08:17AM.  Anion Gap  5  Routine Hem:  13-Dec-15 07:44   WBC (CBC) 5.2  RBC (CBC)  3.62  Hemoglobin (CBC)  12.0  Hematocrit (CBC)  35.0  Platelet Count (CBC)  124  MCV 97  MCH 33.1  MCHC 34.2  RDW  15.5  Neutrophil % 72.2  Lymphocyte % 14.6  Monocyte % 11.6  Eosinophil % 0.8  Basophil % 0.8  Neutrophil # 3.8  Lymphocyte #  0.8  Monocyte # 0.6  Eosinophil # 0.0  Basophil # 0.0 (Result(s) reported on 19 Feb 2014 at 08[facility]   Radiology Impression: Radiology Impression: MRI of brain from 9/15 personally reviewed by me and shows a corpus callosal GBM with edema   Impression/Recommendations: Recommendations:   prior notes reviewed by me  reviewed by me   1.   Seizure-  this is a direct result of the natural progression of his GBM and edema can worsen this Glioblastoma multiform-  very poor overall prognosis especially with location in two hemisphere.  Expect that this has significantly progressed and the treatment is unchanged.  Natural progression is death within 6 months regardless of treatment. Encephalopathy-  probably a combination of both 1 and 2.  Keppra could worsen this load Dexamethasone 1064mV now then continue 4mg3mh decrease Keppra to 750mg96mh IV agree with palliative consult  keep Mg > 2, Ca > 8 and Na >  130 no driving or operating heavy machinery x 6 months will sign off, please call with additional questions can f/u with [facility] in 1 month if he survives  Electronic Signatures: Jamison Neighbor (MD)  (Signed 14-Dec-15 14:19)  Authored: REFERRING PHYSICIAN, Primary Care Physician, Consult, History of Present Illness, Review of Systems, PAST MEDICAL/SURGICAL HISTORY, HOME MEDICATIONS, ALLERGIES, Social/Family History, NURSING VITAL SIGNS, Physical Exam-, LAB RESULTS, RADIOLOGY RESULTS, Recommendations   Last Updated: 14-Dec-15 14:19 by Jamison Neighbor (MD)

## 2014-07-01 NOTE — Consult Note (Signed)
Admit Diagnosis:   R FOOT GANGRENE GLIOBLASTOMA MULTIFORME: Onset Date: 07-Feb-2014, Status: Active, Description: R FOOT GANGRENE GLIOBLASTOMA MULTIFORME    MI:    kidney:    Hyperlipidemia:    Hypertension:   Home Medications: Medication Instructions Status  ALPRAZolam 0.5 mg oral tablet 1 tab(s) orally 2 times a day Active  dexamethasone 4 mg oral tablet Take 1 tablet (4 mg) orally 3 times a day x 7 days, then take 1 tablet (4 mg) orally 2 times a day x 7 days,  then take one-half tablet (2 mg) orally 2 times a day x 14 days, then continue  one-half tablet (2 mg) once daily.  Active  promethazine 25 mg oral tablet 1 tab(s) orally every 6 hours as needed for nausea or vomiting Active  acetaminophen 325 mg oral tablet 2 tab(s) orally every 6 hours, As Needed - for Pain Active  felodipine 5 mg oral tablet, extended release 1 tab(s) orally once a day Active  hydrochlorothiazide 25 mg oral tablet 1 tab(s) orally once a day Active  Toprol-XL 50 mg oral tablet, extended release 1 tab(s) orally once a day Active  timadar 140 milligram(s) orally once a day Active   Lab Results: Routine Chem:  01-Dec-15 04:10   Chloride, Serum 101  Routine Hem:  01-Dec-15 04:10   WBC (CBC) 9.7    No Known Allergies:    General Aspect Pt admitted with gangrenous right 5th toe and cellulitis.  Being followed by Vascular Surgery.   Case History and Physical Exam:  Cardiovascular Left foot with palpable pulses.  Right foot without pulses.   Neurological Full sensation   Skin Gangrenous changes to right 5th ray and early gangrenous changes to great toe and 2nd toe.  No active drainage.  No lymphangitis.  Mild cellulitis.  Left foot without ulceration.    Impression Gangrene right 5th toe and early gangrene to great toe and 2nd toe.   Plan Will await vascular evaluation.   Pt has changes to great toe and 2nd toe. At minimum needs 5th ray amp with debridemnt but I would wait for demarcation of  the great toe and 2nd toe prior to ampuation as this may progress and would need TMA. Will follow.   Electronic Signatures: Samara Deist (MD)  (Signed 01-Dec-15 20:19)  Authored: Health Issues, Significant Events - History, Home Medications, Labs, Allergies, General Aspect/Present Illness, History and Physical Exam, Impression/Plan   Last Updated: 01-Dec-15 20:19 by Samara Deist (MD)

## 2014-07-01 NOTE — Op Note (Signed)
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   PROCEDURES PERFORMED:  1.  Introduction catheter into aorta right groin approach.  2.  Introduction catheter into aorta left groin approach.  3.  Abdominal aortogram with bilateral lower extremity distal runoff.  4.  Percutaneous transluminal angioplasty and stent placement right common iliac artery with an 8 x 40 LifeStar stent post dilated to 8 mm.  5.  Percutaneous transluminal angioplasty and stent placement left common iliac artery with an 8 x 60 LifeStar stent post dilated to 8 mm.  6.  Percutaneous transluminal angioplasty and stent placement left external iliac artery with a 7 x 40 LifeStar stent post dilated to 7.   SURGEON: Katha Cabal, M.D.   Sedation: Versed plus fentanyl with a single dose of IV Benadryl as well. Continuous ECG, pulse oximetry, and cardiopulmonary monitoring was performed throughout the entire procedure by the interventional radiology nurse. Total sedation time was 1 hour, 20 minutes.   ACCESS:  1.  6 French sheath right common femoral artery.  2.  6 French sheath left common femoral artery.   CONTRAST USED: Isovue 115 mL.   FLUOROSCOPY TIME: 10.3 minutes.   INDICATIONS:  Mr. Hunter Booth is an 81 year old gentleman who was sent to the office secondary to gangrenous changes of the right foot and nonpalpable pulses. Risks and benefits for angiography with the hope for intervention were reviewed. All questions answered. The patient has agreed to proceed. Family is in agreement as well.   DESCRIPTION OF PROCEDURE: The patient is taken to special procedures and placed in the supine position. After adequate  sedation is achieved both groins are prepped and draped in a sterile fashion. Appropriate timeout is called.   Ultrasound is then utilized on the left and the common femoral artery is identified. There is significant plaque visible posteriorly, as the ultrasound is used to scan more proximally on the common femoral there does appear to be an artery adequate for access that is echolucent and pulsatile indicating patency. Image is recorded for the permanent record and access is obtained after 1% lidocaine is infiltrated under direct visualization with a micropuncture needle, microwire followed by micro sheath, J-wire followed by a 5 French sheath. Using a combination of a Kumpe catheter, a stiff angled Glidewire, the occlusion of the left common iliac is crossed and the catheter and wire are negotiated into the abdominal aorta. By calcification the patient also has a small abdominal aortic aneurysm.  Kumpe catheter is then exchanged for a pigtail catheter. Abdominal aortogram is obtained. This demonstrates a string sign on the right at the origin of the common iliac with the distal half of the iliac moderately dilated consistent with poststenotic dilatation. It also verifies the occlusion on the left with diffuse disease extending down into the external.   The pigtail catheter is then repositioned to above the bifurcation and bilateral oblique views of the pelvis are obtained and then the detector is returned to the AP and bilateral lower extremity runoff is obtained.   Ultrasound is returned to the field. The right common femoral is evaluated, it is noted to be echolucent and pulsatile indicating patency. Image is recorded for the permanent record. Under real-time visualization a micropuncture needle is  inserted, microwire followed by micro sheath, J-wire followed by a 6 French sheath. Amplatz Super Stiff wire is then negotiated into the aorta under fluoroscopic guidance. Amplatz Super Stiff wire is advanced  through the pigtail catheter on the left and the sheath is upsized to a 6 [place]. 4000 units of heparin are given.   6 x 60 Rival balloons are then advanced over the wire and using the kissing balloon technique inflated to 12 atmospheres for 1 minute. Following this 8 x 40 LifeStar stent is advanced up the right side and an 8 x 60 up the left, they are post dilated with 7 mm balloons and subsequently with 8 mm balloons. A third LifeStar stent is then deployed across the origin of the external iliac on the left and postdilated to 7 mm. This third LifeStar stent is a 7 x 40.   Pigtail catheters re-introduced, bilateral obliques are obtained and subsequently oblique views of each groin are obtained and StarClose devices are deployed. There are no immediate complications.   INTERPRETATION: Imaging of the aorta demonstrates heavily calcified aorta with visible calcific wall on fluoroscopy consistent with a small abdominal aortic aneurysm. There is essentially occlusion of the left common iliac at its origin with diffuse disease throughout the common iliac on the left extending down into the external. Internal iliac on the left demonstrates a string sign at its origin. There are extensive collaterals in the distal aorta. On the right there is a string sign at the origin and subsequent poststenotic dilatation of the common iliac. There is patency of the external iliac.    Common femoral on the right is patent as is the profunda. SFA is patent in its proximal half distally at [facility] canal it occludes. The below knee popliteal is reconstituted with what appears to be 3 vessel runoff to the foot.   On the left the common femoral is diffusely diseased but it is patent. The profunda and superficial femoral are again diffusely diseased but patent and there is continuous flow to the trifurcation with again what appears to be 3 vessel runoff to the foot.   Following angioplasty and stent placement in the distal aorta,  common iliacs, and external iliac on the left there is now patency of the inflow with tremendous improvement and palpable femoral pulses.   SUMMARY: Successful recanalization of the distal aorta common iliacs as described above. The patient will likely given the extent of the damage to his right foot require further interventions. This would be most appropriate in an antegrade direction to recanalize the SFA.    ____________________________ Katha Cabal, MD ggs:bu D: 01/31/2014 13:32:27 ET T: 01/31/2014 13:44:57 ET JOB#: [id number]  cc: Katha Cabal, MD, <Dictator> Sharlotte Alamo, DPM Einar Pheasant, MD Katha Cabal MD ELECTRONICALLY SIGNED 02/07/2014 13:01

## 2014-07-01 NOTE — Consult Note (Signed)
   Comments   Goals of care meeting had over phone with pt's daughter, HCPOA.  Daughter very tearful over pt's clinical status and rapid decline.  Pt daughter understands that pt is very sick and has a terminal illness.  Daughter confirms DNR status for pt.  Daughter would like to see how pt responds to IV steroids before making decision on comfort care.  Electronic Signatures: Rayland Hamed, Judith Part (NP)  (Signed 14-Dec-15 14:17)  Authored: Palliative Care Phifer, Izora Gala (MD)  (Signed 14-Dec-15 15:46)  Authored: Palliative Care   Last Updated: 14-Dec-15 15:46 by Phifer, Izora Gala (MD)

## 2014-07-01 NOTE — Consult Note (Signed)
PATIENT NAME:  Hunter Booth, Hunter Booth MR#:  [id number] DATE OF BIRTH:  [birth year]  DATE OF CONSULTATION:  02/08/2014  REFERRING PHYSICIAN:  Ceasar Lund. Anselm Jungling, MD  CONSULTING PHYSICIAN:  Beyonce Sawatzky R. Ma Hillock, MD  REASON FOR CONSULTATION: History of glioblastoma multiforme, on treatment, admitted with gangrene in the toe.   HISTORY OF PRESENT ILLNESS: An 81 year old gentleman with known history of glioblastoma multiforme recently started radiation and oral chemotherapy with Temodar. The patient had been given a break from treatment for Thanksgiving and ongoing issues with PVD by Dr. Baruch Gouty, he otherwise takes oral Temodar. The patient was found to have medical problems including peripheral vascular disease, status post angioplasty and  stent placement in the right common iliac artery and left common iliac artery, hyperlipidemia, coronary artery disease, hypertension, glioblastoma, chronic smoking. He has recently had problems with right foot swelling, pain and redness and has been seen by vascular surgeon, Dr. Delana Meyer, for gangrene and is now admitted for problems with gangrene and possibly needing amputation of the toe. Podiatry and vascular surgery have been consulted. Regarding GBM, he currently denies any headaches, seizures, or focal weakness. Appetite is good and he is drinking ensure. No bleeding symptoms.    PAST MEDICAL AND SURGICAL HISTORY: As in HPI above.   FAMILY HISTORY: Noncontributory.  SOCIAL HISTORY: Current smoker, currently 10 cigarettes per day. Denies alcohol usage.   HOME MEDICATIONS: Decadron taper, currently at 4 mg daily. Also supposed to be on hydrochlorothiazide and lisinopril but has not been taking them due to low blood pressure. Phenergan 25 mg q6 hours p.r.n. for nausea.    REVIEW OF SYSTEMS: CONSTITUTIONAL: Has generalized weakness and fatigue. No fever or chills. HEENT: denies headaches or dizziness at rest. No epistaxis, ear or jaw pain.  CARDIAC:   No angina,  palpitation, orthopnea or PND.   LUNGS:  Has chronic dyspnea on exertion and cough.  No hemoptysis or chest pain. GASTROINTESTINAL: No nausea, vomiting or diarrhea.  GU: denies dysuria or hematuria.   MUSCULOSKELETAL: no new bone pains NEUROLOGICAL: As in HPI.   ENDOCRINE:  No polyuria or polydipsia.    PHYSICAL EXAMINATION:  GENERAL: The patient is an elderly gentleman, chronically weak, sitting at the side of the bed, otherwise, alert and oriented x 3 and cpnverses appropriately. No acute distress. No icterus.   VITAL SIGNS: afebrile, blood pressure 114/67, 94% on room air. HEENT: Normocephalic, atraumatic. Extraocular movements intact.  NECK: no cervical adenopathy. ABDOMEN: Soft, nontender. EXTREMITIES: mild pedal edema, dressing over right toe.  LABORATORY RESULTS: 12/1 - Hb 13, WBC 9700, ANC 7300, platelets 109K, potassium 3.3, calcium 8.1, blood c/s negative so far.    IMPRESSION AND RECOMMENDATIONS: An 81 year old gentleman with glioblastoma multiforme, still getting radiation treatment and is on concurrent chemotherapy with oral Temodar. The patient also has other multiple medical issues including ongoing issues with peripheral vascular disease causing gangrene of the right side toe and he has been admitted for further treatment of this. Agree with ongoing supportive treatment with broad-spectrum antibiotics, vascular surgery and podiatry are following and likely planning amputation. Have also d/w radiation-oncologist Dr.Chrystal, and plan is to resume on radiation therapy if he continues to maintain current performance status and try to complete planned course of treatment. CBC does not shows any major cytopenias, has mild thrombocytopenia only and no bleeding issues, likely effect of chemotherapy. Otherwise no major pain issues or progressive neurological symptoms, seems to be maintaining his performance status (ECOG 2-3) and good appetite. Hold Temodar until he starts  back on radiation.  Will continue to follow as indicated for any oncology issues. Patient and family presente xplained above details. Thank you for the referral, please feel free to contact me if any additional questions.    ____________________________ Rhett Bannister Ma Hillock, MD srp:bm D: 02/08/2014 23:35:09 ET T: 02/09/2014 00:51:53 ET JOB#: [id number]  cc: Trevor Iha R. Ma Hillock, MD, <Dictator> Alveta Heimlich MD ELECTRONICALLY SIGNED 02/09/2014 9:53

## 2014-07-01 NOTE — Consult Note (Signed)
PATIENT NAME:  Hunter Booth, Hunter Booth MR#:  [id number] DATE OF BIRTH:  1931/08/25  DATE OF CONSULTATION:  02/19/2014  CONSULTING PHYSICIAN:  Kyoko Elsea K. Franchot Mimes, MD  PLACE OF DICTATION:  [facility] room [address], [place], [place].   RACE:  White.  SUBJECTIVE:  The patient was seen in consultation in room #108. According to information obtained from the staff nurse, the patient had been delirious and he is communicative, but only occasionally, not all the time.  He lets staff know what he wants. The patient had been incontinent of urine and has had an amputation and was diagnosed with glioblastoma and is being treated aggressively with chemotherapy and radiation.    MENTAL STATUS:  When undersigned went into the room, the patient was mumbling. He was drowsy, but arousable.  When questions were asked, he had difficulty processing the same.  When he was asked where he was, he said "place."  He was irritable when questions were asked.  Very disheveled in appearance.  Does not appear to be responding to internal stimuli.  Further mental status could not be done because patient constantly mumbling to himself.    IMPRESSION: Cognitive disorder, delirium secondary to brain tumor, glioblastoma.  Recommend Ativan 1 mg p.o. or IM q. 6 hours p.r.n. agitation, Haldol 1 mg p.o. or IM q. 6 hours p.r.n. agitation to help him calm down.    ____________________________ Wallace Cullens. Franchot Mimes, MD skc:LT D: 02/19/2014 18:26:53 ET T: 02/19/2014 19:06:13 ET JOB#: [id number]  cc: Arlyn Leak K. Franchot Mimes, MD, <Dictator> Dewain Penning MD ELECTRONICALLY SIGNED 02/25/2014 15:12

## 2014-07-03 LAB — SURGICAL PATHOLOGY

## 2014-07-05 NOTE — Consult Note (Signed)
PATIENT NAME:  Hunter Booth, Hunter Booth MR#:  [id number] DATE OF BIRTH:  1932/03/03  DATE OF CONSULTATION:  02/07/2014  REFERRING PHYSICIAN:  Carol Ada, MD  CONSULTING PHYSICIAN:  Cheral Marker. Ola Spurr, MD   REASON FOR CONSULTATION: Gangrene and cellulitis.   HISTORY OF PRESENT ILLNESS: This is a pleasant 81 year old gentleman with a history of relatively recently diagnosed glioblastoma multiform as well as severe peripheral vascular disease and ongoing tobacco abuse who recently had stent placement in his right common iliac artery November 24th. He was admitted November 30th for increasing worsening of his foot pain. His pain had progressed which is why his family brought him in. He has not been having any fevers or chills. He had a mildly elevated white count. He is going to be seen by podiatry.   PAST MEDICAL HISTORY:  1.  Glioblastoma multiform, on Decadron as well as radiation, following with Dr. Ma Hillock. 2.  Peripheral vascular disease.  3.  Hypertension.  4.  Hyperlipidemia.  5.  Coronary artery disease.  6.  Tobacco abuse.   FAMILY HISTORY: Noncontributory.   SOCIAL HISTORY: Currently smoking 10 cigarettes a day, down from 2 packs a day. No alcohol.   REVIEW OF SYSTEMS: Eleven systems reviewed and negative except as per HPI.   ALLERGIES: No known drug allergies.   ANTIBIOTICS: Currently, he is on vancomycin and Zosyn as well as clindamycin.   PHYSICAL EXAMINATION:  VITAL SIGNS: Temperature 97.4, pulse 62, blood pressure 122/71, respirations 20, saturation 96% on room air.  GENERAL: He is thin, pleasant, interactive, in no acute distress.  HEENT: Pupils equal, round and reactive to light and accommodation. Extraocular movements are intact. Sclerae anicteric. Oropharynx clear.  NECK: Supple.  HEART: Regular.  LUNGS: Clear.  ABDOMEN: Soft, nontender.  EXTREMITIES: His right foot has necrotic gangrene with mummification on the right fifth toe as well as the first toe. There is a  very foul odor. There is surrounding dorsal erythema and edema. It is quite tender.   DATA: White blood count 12.3, now down to 9.7, hemoglobin 13.0, platelets 109,000. Renal functions, creatinine at 1.3. Blood cultures 2 of 2 negative.   IMPRESSION: An 81 year old gentleman with incurable glioblastoma multiform, severe peripheral vascular disease, long-standing and ongoing tobacco abuse, admitted 6 days after revascularization of his right leg for gangrene. He has extensive gangrene as well as pain and swelling in the foot. He has a mildly elevated white count, but no fevers.   RECOMMENDATIONS:  1.  I would consider palliative care consult.  2.  Continue vancomycin and piperacillin. Discontinue clindamycin.  3.  Further recommendations based on decision regarding whether to undergo surgery or not.  Thank you for the consult. I will be glad to follow with you.   ____________________________ Cheral Marker. Ola Spurr, MD dpf:TT D: 02/07/2014 14:05:00 ET T: 02/07/2014 15:10:35 ET JOB#: [id number]  cc: Cheral Marker. Ola Spurr, MD, <Dictator> Madylin Fairbank Ola Spurr MD ELECTRONICALLY SIGNED 03/12/2014 21:22

## 2016-09-15 IMAGING — MR MRI HEAD WITHOUT AND WITH CONTRAST
11 of 13 series · 38 of 48 positions shown · IV contrast (14 ML MULTIHANCE)
Comparison: 08/12/2006 MR.

CLINICAL DATA: 82-year-old male presenting with confusion and
memory loss.

EXAM:
MRI HEAD WITHOUT AND WITH CONTRAST
TECHNIQUE: Multiplanar, multiecho pulse sequences of the brain and surrounding
structures were obtained without and with intravenous contrast.
CONTRAST:  14 cc MultiHance.

[Series 4: DWI · axial · 5.0mm · 1.80mm/px · z∈[-42,+115]mm · 3 of 26 slices shown (1 of 4)]
[im 1/26]
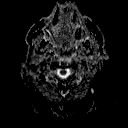
[im 13/26]
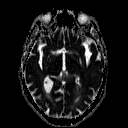
[im 26/26]
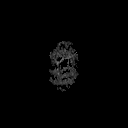

[Series 6: DWI · coronal · 5.0mm · 1.80mm/px · 4 of 39 slices shown (2 of 4)]
[im 1/39]
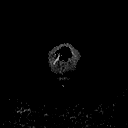
[im 13/39]
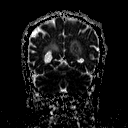
[im 26/39]
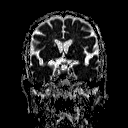
[im 39/39]
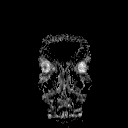

[Series 7: T2 · axial · 5.0mm · 0.60mm/px · z∈[-46,+117]mm · 3 of 27 slices shown (1 of 2)]
[im 1/27]
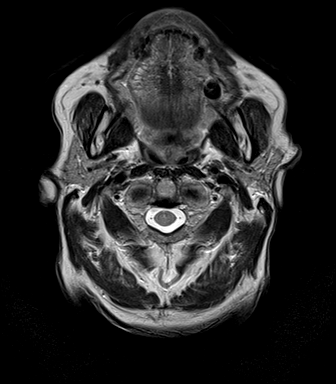
[im 14/27]
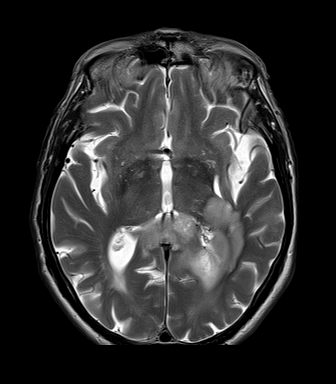
[im 27/27]
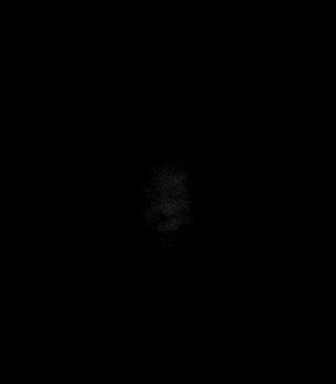

[Series 8: FLAIR · axial · 5.0mm · 0.45mm/px · z∈[-46,+117]mm · 3 of 27 slices shown]
[im 1/27]
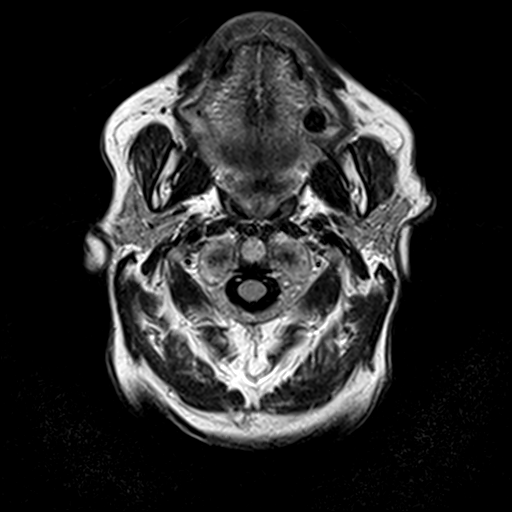
[im 14/27]
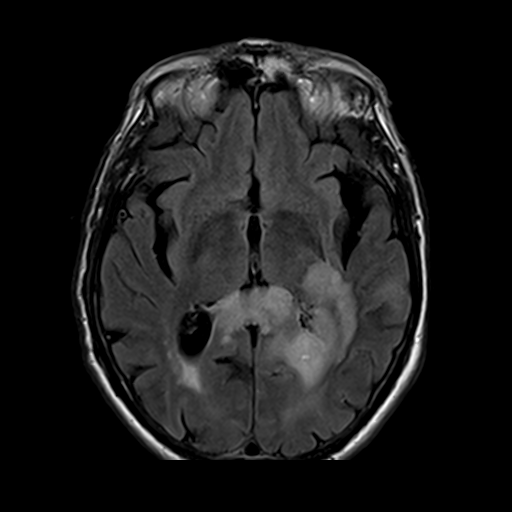
[im 27/27]
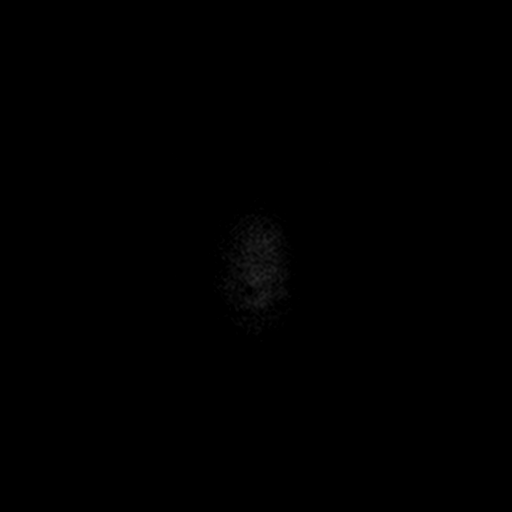

[Series 9: T2 · axial · 5.0mm · 0.45mm/px · z∈[-46,+117]mm · 3 of 27 slices shown (2 of 2)]
[im 1/27]
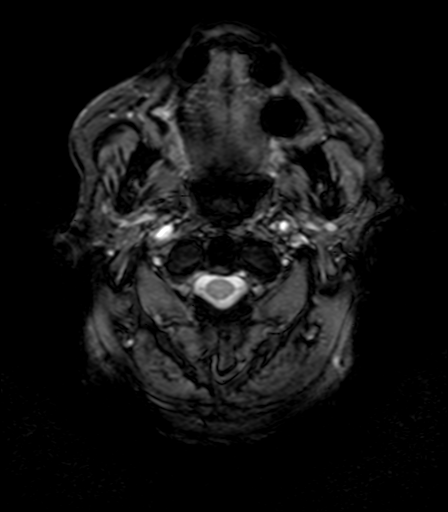
[im 14/27]
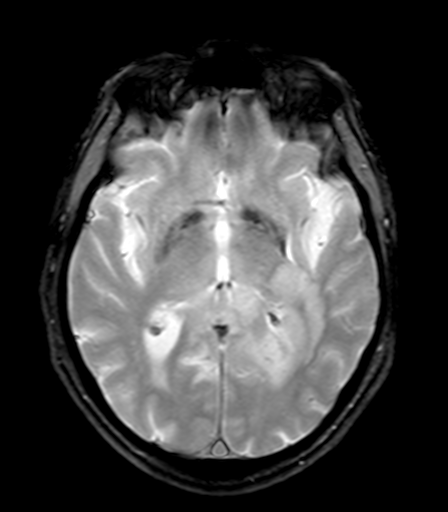
[im 27/27]
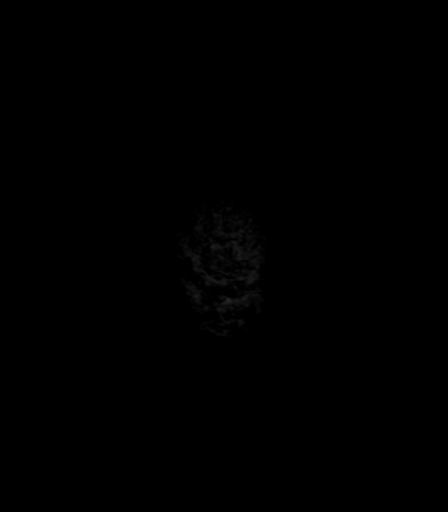

[Series 11: T2 post-contrast · coronal · 5.0mm · 0.49mm/px · 3 of 31 slices shown]
[im 1/31]
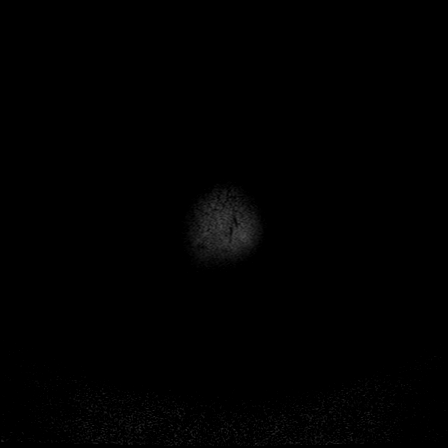
[im 16/31]
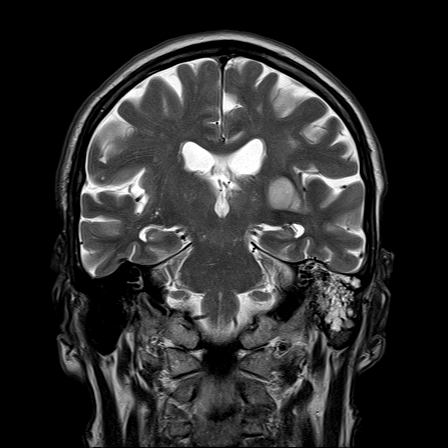
[im 31/31]
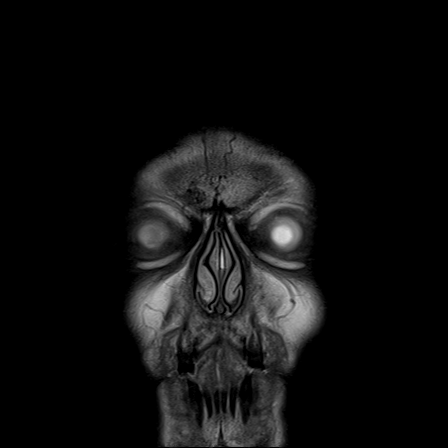

[Series 12: T1 post-contrast · axial · 3.0mm · 1.00mm/px · z∈[-40,+119]mm · 6 of 56 slices shown (1 of 3)]
[im 1/56]
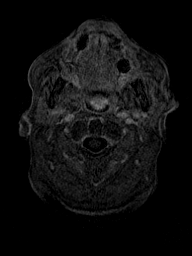
[im 12/56]
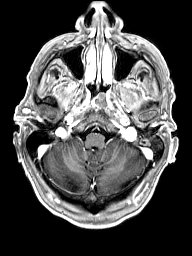
[im 23/56]
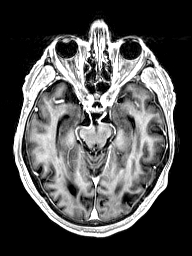
[im 34/56]
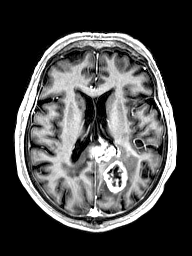
[im 45/56]
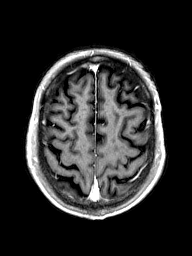
[im 56/56]
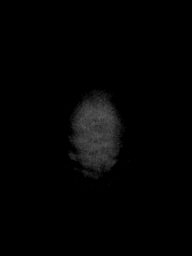

[Series 13: T1 post-contrast · coronal · 5.0mm · 0.43mm/px · 3 of 31 slices shown (2 of 3)]
[im 1/31]
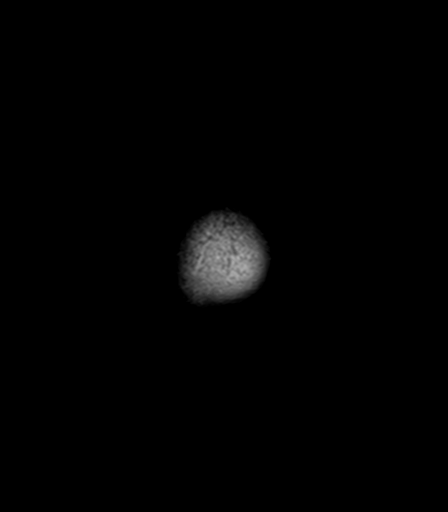
[im 16/31]
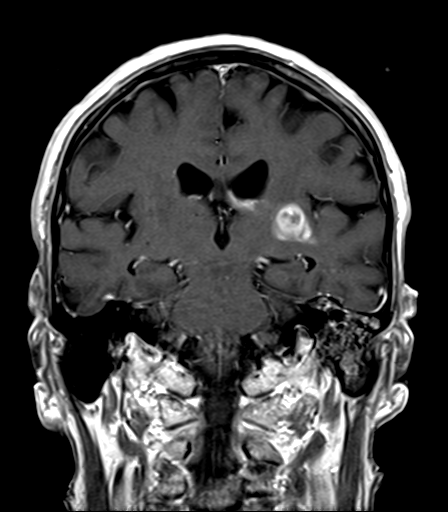
[im 31/31]
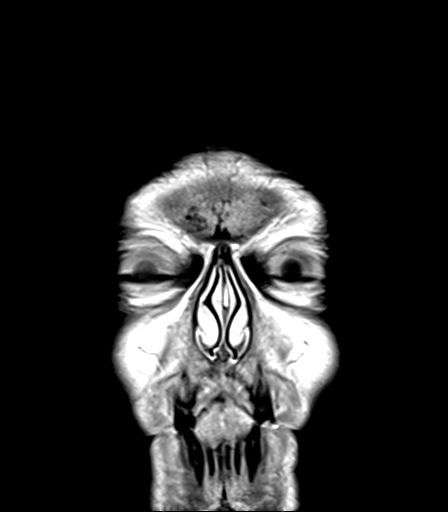

[Series 14: T1 post-contrast · sagittal · 5.0mm · 0.45mm/px · 3 of 30 slices shown (3 of 3)]
[im 1/30]
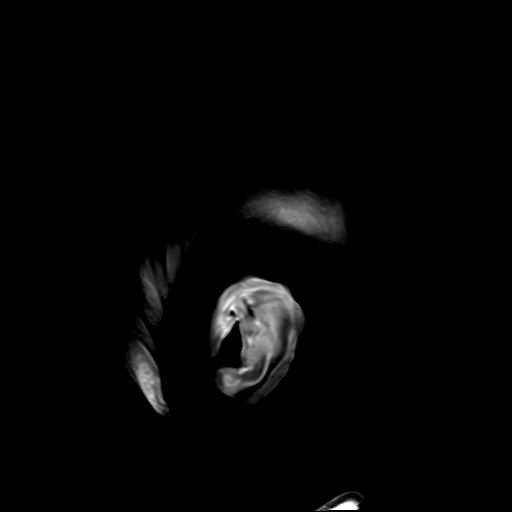
[im 15/30]
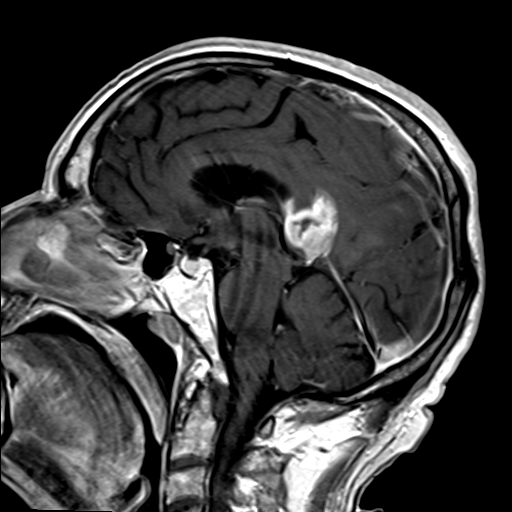
[im 30/30]
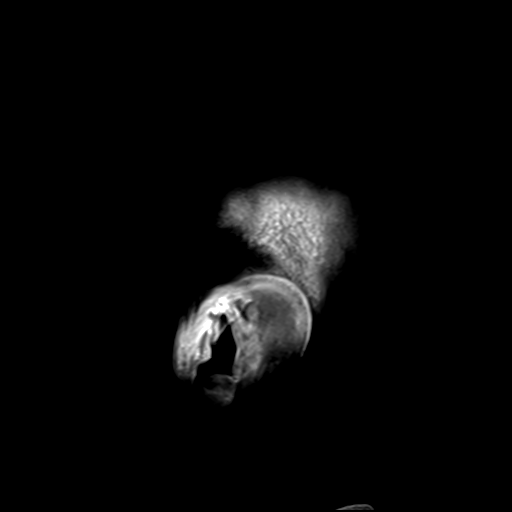

[Series 100: DWI · axial · 5.0mm · 1.80mm/px · z∈[-42,+121]mm · 3 of 27 slices shown (3 of 4)]
[im 1/27]
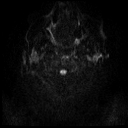
[im 14/27]
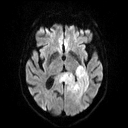
[im 27/27]
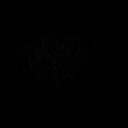

[Series 101: DWI · coronal · 5.0mm · 1.80mm/px · 4 of 39 slices shown (4 of 4)]
[im 1/39]
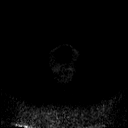
[im 13/39]
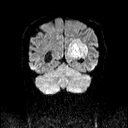
[im 26/39]
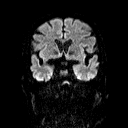
[im 39/39]
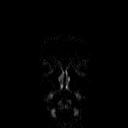

[38 of 48 positions shown; findings below may reference images not displayed]

FINDINGS: Large irregular enhancing lesion with epicenter in the left
periatrial region has extension to posterior aspect of the left
lenticular nucleus, posterior left operculum and crosses the
splenium of the corpus callosum with approximate maximal dimensions
of 5.6 x 4.6 x 6 cm.

The appearance is worrisome for glioblastoma with lymphoma a
secondary less likely consideration. Metastatic disease felt to be
much less likely consideration.

Subependymal spread tumor posterior horn left lateral ventricle and
left periatrial region.

Local mass effect upon the atrium of the left lateral ventricle.

T2/FLAIR altered signal intensity surrounds the necrotic enhancing
component of tumor. In addition to vasogenic edema, this T2/FLAIR
altered signal intensity typically contains tumor cells. This
extends to involve the right periatrial region.

Within the peripheral aspect of the left parietal lobe, 7 mm
nonenhancing lesion demonstrates restricted motion. This is felt
most likely represent tumor rather than infarct.

Major intracranial vascular structures are patent.

Tiny area of blood breakdown products right operculum region and
right cerebellar tonsils most consistent with result of prior
hemorrhagic ischemia.

Opacification left mastoid air cells. Lobulated 1.7 cm lesion
posterior superior left nasopharynx. Question malignancy versus non
malignant process such as complex asymmetric Thornwaldt cyst.

Mild spinal stenosis C3-4.

Small vessel disease type changes.
IMPRESSION: Irregular necrotic-appearing mass centered at the left periatrial
region with extension of tumor as detailed above including crossing
the splenium of the corpus callosum suspicious for glioblastoma.
Lymphoma is a secondary consideration with metastatic disease felt
to be much less likely consideration. Local mass effect most
prominent left periatrial region with subependymal spread of tumor
left periatrial region suspected.

Opacification left mastoid air cells. Lobulated 1.7 cm lesion
posterior superior left nasopharynx. Question malignancy versus non
malignant process such as complex asymmetric Thornwaldt cyst.

These results were called by telephone at the time of interpretation
on 11/28/2013 at [DATE] to Dr. SIGFREDO BHALLA , who verbally
acknowledged these results.
